# Patient Record
Sex: Male | Born: 1945 | Race: White | Hispanic: No | Marital: Married | Smoking: Former smoker
Health system: Southern US, Community
[De-identification: ages and names within clinical notes are randomized; demographics above are authoritative.]

## PROBLEM LIST (undated history)

## (undated) DIAGNOSIS — E785 Hyperlipidemia, unspecified: Secondary | ICD-10-CM

## (undated) DIAGNOSIS — I10 Essential (primary) hypertension: Secondary | ICD-10-CM

## (undated) HISTORY — DX: Essential (primary) hypertension: I10

## (undated) HISTORY — DX: Hyperlipidemia, unspecified: E78.5

---

## 1997-08-16 HISTORY — PX: THYROIDECTOMY, PARTIAL: SHX18

## 2002-12-06 ENCOUNTER — Ambulatory Visit (HOSPITAL_BASED_OUTPATIENT_CLINIC_OR_DEPARTMENT_OTHER): Admission: RE | Admit: 2002-12-06 | Discharge: 2002-12-06 | Payer: Self-pay | Admitting: Orthopedic Surgery

## 2005-12-09 ENCOUNTER — Ambulatory Visit: Payer: Self-pay | Admitting: Gastroenterology

## 2005-12-27 ENCOUNTER — Ambulatory Visit: Payer: Self-pay | Admitting: Gastroenterology

## 2006-12-16 ENCOUNTER — Encounter: Admission: RE | Admit: 2006-12-16 | Discharge: 2006-12-16 | Payer: Self-pay | Admitting: Geriatric Medicine

## 2006-12-21 ENCOUNTER — Encounter (INDEPENDENT_AMBULATORY_CARE_PROVIDER_SITE_OTHER): Payer: Self-pay | Admitting: Specialist

## 2006-12-21 ENCOUNTER — Other Ambulatory Visit: Admission: RE | Admit: 2006-12-21 | Discharge: 2006-12-21 | Payer: Self-pay | Admitting: Interventional Radiology

## 2006-12-21 ENCOUNTER — Encounter: Admission: RE | Admit: 2006-12-21 | Discharge: 2006-12-21 | Payer: Self-pay | Admitting: Geriatric Medicine

## 2007-03-16 ENCOUNTER — Ambulatory Visit (HOSPITAL_COMMUNITY): Admission: RE | Admit: 2007-03-16 | Discharge: 2007-03-17 | Payer: Self-pay | Admitting: Surgery

## 2007-03-16 ENCOUNTER — Encounter (INDEPENDENT_AMBULATORY_CARE_PROVIDER_SITE_OTHER): Payer: Self-pay | Admitting: Surgery

## 2010-08-18 ENCOUNTER — Encounter
Admission: RE | Admit: 2010-08-18 | Discharge: 2010-08-18 | Payer: Self-pay | Source: Home / Self Care | Attending: Geriatric Medicine | Admitting: Geriatric Medicine

## 2010-12-29 NOTE — Op Note (Signed)
NAME:  Damon Sanders, Damon Sanders             ACCOUNT NO.:  [id number]   MEDICAL RECORD NO.:  [id number]          PATIENT TYPE:  OIB   LOCATION:  1303                         FACILITY:  [facility]   PHYSICIAN:  Velora Heckler, MD      DATE OF BIRTH:  Nov 21, 1945   DATE OF PROCEDURE:  03/16/2007  DATE OF DISCHARGE:                               OPERATIVE REPORT   PREOPERATIVE DIAGNOSIS:  Left thyroid nodule.   POSTOPERATIVE DIAGNOSIS:  Left thyroid nodule.   PROCEDURE:  Left thyroid lobectomy.   SURGEON:  Velora Heckler, M.D., FACS   ASSISTANT:  Clovis Pu. Cornett, M.D., FACS   ANESTHESIA:  General per Dr. Sherrian Divers.   ESTIMATED BLOOD LOSS:  Minimal.   PREPARATION:  Betadine.   COMPLICATIONS:  None.   INDICATIONS:  The patient is a 65 year old white male from [place],  [place].  In April 2008, he was found to have a palpable nodule  on routine physical examination by Dr. Merlene Laughter.  Ultrasound showed  a mildly enlarged thyroid gland.  There was a dominant nodule in the  left inferior pole measuring greater than 2 cm in size.  Cytopathology  on needle biopsy showed mild nuclear enlargement and nuclear clearing.  The patient now comes to surgery for resection for definitive diagnosis.   BODY OF REPORT:  The procedure is done in OR 11 at [facility].  The patient is brought to the operating room and placed in the supine  position on the operating room table.  Following the administration of  general anesthesia, the patient is prepped and draped in the usual  strict aseptic fashion.  After ascertaining that an adequate level of  anesthesia had been obtained, a Kocher incision was made a #15 blade.  Dissection was carried through subcutaneous tissues and platysma.  Hemostasis was obtained with electrocautery.  Skin flaps were elevated  cephalad and caudad from the thyroid notch to the sternal notch.  A  Mahorner self-retaining retractor was placed for exposure.  The  strap  muscles were incised in the midline.   Dissection was initially begun on the left side.  The strap muscles were  reflected laterally exposing the left thyroid lobe.  The superior pole  was dissected out.  The superior pole vessels were divided between  medium Liga clips with the harmonic scalpel.  Inferior venous  tributaries were divided between medium Liga clips with the harmonic  scalpel.  The gland is rolled anteriorly.  Parathyroid tissue was  identified and preserved.  Branches of the inferior thyroid artery are  divided between small hemoclips with the harmonic scalpel.  The  recurrent nerve was identified and preserved.  The gland is rolled  further anteriorly and the ligament of Allyson Sabal is transected with  electrocautery.  The isthmus is mobilized across the midline.  A small  pyramidal lobe was included with the isthmus.  The isthmus is transected  to the right of midline with the harmonic scalpel.  The specimen is  submitted to pathology labeled as left thyroid lobe.  Good hemostasis  was noted in the left  thyroid bed.  Surgicel was placed in the operative  field.   The right thyroid lobe was then exposed by reflecting the strap muscles  laterally.  There is a subcentimeter nodule just at the margin of the  anterior right lobe.  This is excised with the harmonic scalpel and  submitted separately labeled as right isthmus nodule.  Good hemostasis  was noted.  The strap muscles were reapproximated in the midline with  interrupted 3-0 Vicryl sutures.  The platysma was closed with  interrupted 3-0 Vicryl sutures.  The skin was closed with a running 4-0  Monocryl subcuticular suture.  The wound was washed and dried.  Benzoin  and Steri-Strips were applied.  Sterile dressings were applied.  The  patient is awakened from anesthesia and brought to the recovery room in  stable condition.  The patient tolerated the procedure well.      Velora Heckler, MD  Electronically  Signed     TMG/MEDQ  D:  03/16/2007  T:  03/16/2007  Job:  [id number]   cc:   Hal T. Stoneking, M.D.  Fax: [phone]

## 2011-01-01 NOTE — Op Note (Signed)
   NAME:  Damon Sanders, Damon Sanders                       ACCOUNT NO.:  [id number]   MEDICAL RECORD NO.:  [id number]                   PATIENT TYPE:  AMB   LOCATION:  DSC                                  FACILITY:  [facility]   PHYSICIAN:  Loreta Ave, M.D.              DATE OF BIRTH:  12-02-1945   DATE OF PROCEDURE:  12/06/2002  DATE OF DISCHARGE:                                 OPERATIVE REPORT   PREOPERATIVE DIAGNOSES:  Symptomatic mucinous cyst ganglion, dorsal aspect  of proximal interphalangeal joint, right index finger.   POSTOPERATIVE DIAGNOSES:  Symptomatic mucinous cyst ganglion, dorsal aspect  of proximal interphalangeal joint, right index finger.   OPERATION PERFORMED:  Excision of mucinous cyst ganglion dorsal aspect of  distal interphalangeal joint, right index finger with joint exploration.   SURGEON:  Loreta Ave, M.D.   ASSISTANT:  Arlys John D. Petrarca, P.A.-C.   ANESTHESIA:  IV region.   SPECIMENS:  None.   CULTURES:  None.   COMPLICATIONS:  None.   DRESSING:  Soft compressive.   DESCRIPTION OF PROCEDURE:  The patient was brought to the operating room and  after adequate anesthesia had been obtained, the right arm was prepped and  draped in the usual sterile fashion.  The cyst was in typical location on  the dorsal aspect of the PIP joint, right index finger, ulnar aspect in the  extensor tendon.  Transverse exposure.  Cyst excised in its entirety,  carrying this all the way down to the DIP joint where the cyst was emanating  adjacent to the extensor tendon.  Excised in its entirety.  The window into  the joint opened and the joint explored with minimal degenerative changes.  No significant periarticular spurring.  Longitudinal continuity of the  extensor tendon and collateral ligament maintained.  Wound irrigated and  then closed with nylon.  Sterile compressive dressing applied.  Anesthesia  reversed.  Brought to recovery room.  Tolerated surgery well  without  complication.                                                Loreta Ave, M.D.    DFM/MEDQ  D:  12/06/2002  T:  12/07/2002  Job:  [id number]

## 2011-01-19 ENCOUNTER — Encounter: Payer: Self-pay | Admitting: Gastroenterology

## 2011-05-31 LAB — BASIC METABOLIC PANEL
BUN: 8
Calcium: 9.7
GFR calc non Af Amer: >60
Glucose, Bld: 109 — ABNORMAL HIGH
Potassium: 4
Sodium: 142

## 2011-05-31 LAB — DIFFERENTIAL
Basophils Absolute: 0
Basophils Relative: 0
Lymphocytes Relative: 25
Neutro Abs: 4.5
Neutrophils Relative %: 67

## 2011-05-31 LAB — PROTIME-INR: INR: 1

## 2011-05-31 LAB — URINALYSIS, ROUTINE W REFLEX MICROSCOPIC
Hgb urine dipstick: NEGATIVE
Nitrite: NEGATIVE
Protein, ur: NEGATIVE
Specific Gravity, Urine: 1.009
Urobilinogen, UA: 0.2

## 2011-05-31 LAB — CBC
MCHC: 34.5
RBC: 4.46
RDW: 13

## 2011-09-28 ENCOUNTER — Other Ambulatory Visit: Payer: Self-pay | Admitting: Geriatric Medicine

## 2011-09-28 DIAGNOSIS — E049 Nontoxic goiter, unspecified: Secondary | ICD-10-CM

## 2011-09-29 ENCOUNTER — Ambulatory Visit
Admission: RE | Admit: 2011-09-29 | Discharge: 2011-09-29 | Disposition: A | Discharge status: Home / Self Care | Payer: BC Managed Care – PPO | Source: Ambulatory Visit | Attending: Geriatric Medicine | Admitting: Geriatric Medicine

## 2011-09-29 DIAGNOSIS — E049 Nontoxic goiter, unspecified: Secondary | ICD-10-CM

## 2012-05-05 ENCOUNTER — Encounter: Payer: Self-pay | Admitting: Gastroenterology

## 2012-05-10 ENCOUNTER — Encounter: Payer: Self-pay | Admitting: Gastroenterology

## 2012-06-12 ENCOUNTER — Encounter: Payer: Self-pay | Admitting: Gastroenterology

## 2012-06-12 ENCOUNTER — Ambulatory Visit (AMBULATORY_SURGERY_CENTER): Payer: BC Managed Care – PPO | Admitting: *Deleted

## 2012-06-12 VITALS — Ht 67.0 in | Wt 174.0 lb

## 2012-06-12 DIAGNOSIS — Z1211 Encounter for screening for malignant neoplasm of colon: Secondary | ICD-10-CM

## 2012-06-12 MED ORDER — MOVIPREP 100 G PO SOLR: ORAL | Status: DC

## 2012-06-26 ENCOUNTER — Other Ambulatory Visit: Payer: Self-pay | Admitting: Gastroenterology

## 2012-06-26 ENCOUNTER — Ambulatory Visit (AMBULATORY_SURGERY_CENTER): Payer: BC Managed Care – PPO | Admitting: Gastroenterology

## 2012-06-26 ENCOUNTER — Encounter: Payer: Self-pay | Admitting: Gastroenterology

## 2012-06-26 VITALS — BP 142/78 | HR 69 | Temp 98.1°F | Resp 12 | Ht 67.0 in | Wt 174.0 lb

## 2012-06-26 DIAGNOSIS — Z1211 Encounter for screening for malignant neoplasm of colon: Secondary | ICD-10-CM

## 2012-06-26 MED ORDER — SODIUM CHLORIDE 0.9 % IV SOLN: 500.0000 mL | INTRAVENOUS | Status: DC

## 2012-06-26 MED ORDER — HYDROCORTISONE ACETATE 1 % EX CREA: 1.0000 "application " | TOPICAL_CREAM | Freq: Every evening | CUTANEOUS | Status: DC | PRN

## 2012-06-26 NOTE — Patient Instructions (Addendum)
YOU HAD AN ENDOSCOPIC PROCEDURE TODAY AT THE [facility]: Refer to the procedure report that was given to you for any specific questions about what was found during the examination.  If the procedure report does not answer your questions, please call your gastroenterologist to clarify.  If you requested that your care partner not be given the details of your procedure findings, then the procedure report has been included in a sealed envelope for you to review at your convenience later.  YOU SHOULD EXPECT: Some feelings of bloating in the abdomen. Passage of more gas than usual.  Walking can help get rid of the air that was put into your GI tract during the procedure and reduce the bloating. If you had a lower endoscopy (such as a colonoscopy or flexible sigmoidoscopy) you may notice spotting of blood in your stool or on the toilet paper. If you underwent a bowel prep for your procedure, then you may not have a normal bowel movement for a few days.  DIET: Your first meal following the procedure should be a light meal and then it is ok to progress to your normal diet.  A half-sandwich or bowl of soup is an example of a good first meal.  Heavy or fried foods are harder to digest and may make you feel nauseous or bloated.  Likewise meals heavy in dairy and vegetables can cause extra gas to form and this can also increase the bloating.  Drink plenty of fluids but you should avoid alcoholic beverages for 24 hours.  ACTIVITY: Your care partner should take you home directly after the procedure.  You should plan to take it easy, moving slowly for the rest of the day.  You can resume normal activity the day after the procedure however you should NOT DRIVE or use heavy machinery for 24 hours (because of the sedation medicines used during the test).    SYMPTOMS TO REPORT IMMEDIATELY: A gastroenterologist can be reached at any hour.  During normal business hours, 8:30 AM to 5:00 PM Monday through Friday,  call [phone].  After hours and on weekends, please call the GI answering service at [phone] who will take a message and have the physician on call contact you.   Following lower endoscopy (colonoscopy or flexible sigmoidoscopy):  Excessive amounts of blood in the stool  Significant tenderness or worsening of abdominal pains  Swelling of the abdomen that is new, acute  Fever of 100F or higher     FOLLOW UP: If any biopsies were taken you will be contacted by phone or by letter within the next 1-3 weeks.  Call your gastroenterologist if you have not heard about the biopsies in 3 weeks.  Our staff will call the home number listed on your records the next business day following your procedure to check on you and address any questions or concerns that you may have at that time regarding the information given to you following your procedure. This is a courtesy call and so if there is no answer at the home number and we have not heard from you through the emergency physician on call, we will assume that you have returned to your regular daily activities without incident.  SIGNATURES/CONFIDENTIALITY: You and/or your care partner have signed paperwork which will be entered into your electronic medical record.  These signatures attest to the fact that that the information above on your After Visit Summary has been reviewed and is understood.  Full responsibility of the  confidentiality of this discharge information lies with you and/or your care-partner.   Hemorrhoid information given.  Repeat colonoscopy in 5 years due to family history of colon cancer.

## 2012-06-26 NOTE — Progress Notes (Signed)
Patient did not experience any of the following events: a burn prior to discharge; a fall within the facility; wrong site/side/patient/procedure/implant event; or a hospital transfer or hospital admission upon discharge from the facility. (G8907) Patient did not have preoperative order for IV antibiotic SSI prophylaxis. (G8918)  

## 2012-06-26 NOTE — Op Note (Signed)
[facility] [address] [place], [address]   COLONOSCOPY PROCEDURE REPORT  PATIENT: Damon Sanders, Damon Sanders  MR#: [id number] BIRTHDATE: 09/18/1945 , 66  yrs. old GENDER: Male ENDOSCOPIST: Mardella Layman, MD, [facility] REFERRED BY: PROCEDURE DATE:  06/26/2012 PROCEDURE:   Colonoscopy, screening ASA CLASS:   Class II INDICATIONS:patient's immediate family history of colon cancer. MEDICATIONS: propofol (Diprivan) 200mg  IV  DESCRIPTION OF PROCEDURE:   After the risks and benefits and of the procedure were explained, informed consent was obtained.  A digital rectal exam revealed external hemorrhoids.    The LB CF-H180AL [id number]  endoscope was introduced through the anus and advanced to the cecum, which was identified by both the appendix and ileocecal valve .  The quality of the prep was adequate, using MoviPrep . The instrument was then slowly withdrawn as the colon was fully examined.     COLON FINDINGS: A normal appearing cecum, ileocecal valve, and appendiceal orifice were identified.  The ascending, hepatic flexure, transverse, splenic flexure, descending, sigmoid colon and rectum appeared unremarkable.  No polyps or cancers were seen. External hemorrhoids.  Retroflexed views revealed external hemorrhoids.     The scope was then withdrawn from the patient and the procedure completed.  COMPLICATIONS: There were no complications. ENDOSCOPIC IMPRESSION: 1.   Normal colon 2.   External hemorrhoids RECOMMENDATIONS: Given your significant family history of colon cancer, you should have a repeat colonoscopy in 5 years   REPEAT EXAM:  cc:Dr.Stoneking  _______________________________ eSignedMardella Layman, MD, [facility] 06/26/2012 9:30 AM

## 2012-06-27 ENCOUNTER — Telehealth: Payer: Self-pay | Admitting: *Deleted

## 2012-06-27 NOTE — Telephone Encounter (Signed)
  Follow up Call-  Call back number 06/26/2012  Post procedure Call Back phone  # [phone] hm speak with Darleen  Permission to leave phone message Yes     Patient questions:  Do you have a fever, pain , or abdominal swelling? no Pain Score  0 *  Have you tolerated food without any problems? yes  Have you been able to return to your normal activities? yes  Do you have any questions about your discharge instructions: Diet   no Medications  no Follow up visit  no  Do you have questions or concerns about your Care? no  Actions: * If pain score is 4 or above: No action needed, pain <4.  PT. Gone to work information provided via spouse.

## 2012-08-22 ENCOUNTER — Other Ambulatory Visit: Payer: Self-pay | Admitting: Geriatric Medicine

## 2012-08-22 DIAGNOSIS — E049 Nontoxic goiter, unspecified: Secondary | ICD-10-CM

## 2012-08-23 ENCOUNTER — Ambulatory Visit
Admission: RE | Admit: 2012-08-23 | Discharge: 2012-08-23 | Disposition: A | Discharge status: Home / Self Care | Payer: BC Managed Care – PPO | Source: Ambulatory Visit | Attending: Geriatric Medicine | Admitting: Geriatric Medicine

## 2012-08-23 DIAGNOSIS — E049 Nontoxic goiter, unspecified: Secondary | ICD-10-CM

## 2012-12-12 ENCOUNTER — Other Ambulatory Visit: Payer: Self-pay | Admitting: Oral Surgery

## 2013-08-29 ENCOUNTER — Other Ambulatory Visit: Payer: Self-pay | Admitting: Geriatric Medicine

## 2013-08-29 DIAGNOSIS — E049 Nontoxic goiter, unspecified: Secondary | ICD-10-CM

## 2013-09-03 ENCOUNTER — Ambulatory Visit
Admission: RE | Admit: 2013-09-03 | Discharge: 2013-09-03 | Disposition: A | Discharge status: Home / Self Care | Payer: BC Managed Care – PPO | Source: Ambulatory Visit | Attending: Geriatric Medicine | Admitting: Geriatric Medicine

## 2013-09-03 DIAGNOSIS — E049 Nontoxic goiter, unspecified: Secondary | ICD-10-CM

## 2013-09-04 ENCOUNTER — Other Ambulatory Visit: Payer: Self-pay | Admitting: Geriatric Medicine

## 2013-09-04 DIAGNOSIS — E041 Nontoxic single thyroid nodule: Secondary | ICD-10-CM

## 2013-09-05 ENCOUNTER — Ambulatory Visit
Admission: RE | Admit: 2013-09-05 | Discharge: 2013-09-05 | Disposition: A | Discharge status: Home / Self Care | Payer: BC Managed Care – PPO | Source: Ambulatory Visit | Attending: Geriatric Medicine | Admitting: Geriatric Medicine

## 2013-09-05 ENCOUNTER — Other Ambulatory Visit (HOSPITAL_COMMUNITY)
Admission: RE | Admit: 2013-09-05 | Discharge: 2013-09-05 | Disposition: A | Discharge status: Home / Self Care | Payer: BC Managed Care – PPO | Source: Ambulatory Visit | Attending: Interventional Radiology | Admitting: Interventional Radiology

## 2013-09-05 DIAGNOSIS — E041 Nontoxic single thyroid nodule: Secondary | ICD-10-CM

## 2014-03-11 ENCOUNTER — Other Ambulatory Visit: Payer: Self-pay | Admitting: Geriatric Medicine

## 2014-03-11 DIAGNOSIS — Z87891 Personal history of nicotine dependence: Secondary | ICD-10-CM

## 2014-03-11 DIAGNOSIS — Z136 Encounter for screening for cardiovascular disorders: Secondary | ICD-10-CM

## 2014-03-11 DIAGNOSIS — Z0000 Encounter for general adult medical examination without abnormal findings: Secondary | ICD-10-CM

## 2014-03-25 ENCOUNTER — Ambulatory Visit
Admission: RE | Admit: 2014-03-25 | Discharge: 2014-03-25 | Disposition: A | Discharge status: Home / Self Care | Payer: Commercial Managed Care - HMO | Source: Ambulatory Visit | Attending: Geriatric Medicine | Admitting: Geriatric Medicine

## 2014-03-25 DIAGNOSIS — Z136 Encounter for screening for cardiovascular disorders: Secondary | ICD-10-CM

## 2014-03-25 DIAGNOSIS — Z0000 Encounter for general adult medical examination without abnormal findings: Secondary | ICD-10-CM

## 2014-03-25 DIAGNOSIS — Z87891 Personal history of nicotine dependence: Secondary | ICD-10-CM

## 2014-08-20 DIAGNOSIS — H4011X2 Primary open-angle glaucoma, moderate stage: Secondary | ICD-10-CM | POA: Diagnosis not present

## 2014-08-20 DIAGNOSIS — H2513 Age-related nuclear cataract, bilateral: Secondary | ICD-10-CM | POA: Diagnosis not present

## 2014-08-20 DIAGNOSIS — H4011X1 Primary open-angle glaucoma, mild stage: Secondary | ICD-10-CM | POA: Diagnosis not present

## 2014-10-10 DIAGNOSIS — E780 Pure hypercholesterolemia: Secondary | ICD-10-CM | POA: Diagnosis not present

## 2014-10-10 DIAGNOSIS — Z0000 Encounter for general adult medical examination without abnormal findings: Secondary | ICD-10-CM | POA: Diagnosis not present

## 2014-10-10 DIAGNOSIS — I10 Essential (primary) hypertension: Secondary | ICD-10-CM | POA: Diagnosis not present

## 2014-10-10 DIAGNOSIS — I4510 Unspecified right bundle-branch block: Secondary | ICD-10-CM | POA: Diagnosis not present

## 2014-10-10 DIAGNOSIS — Z1389 Encounter for screening for other disorder: Secondary | ICD-10-CM | POA: Diagnosis not present

## 2014-10-10 DIAGNOSIS — Z23 Encounter for immunization: Secondary | ICD-10-CM | POA: Diagnosis not present

## 2014-10-10 DIAGNOSIS — Z79899 Other long term (current) drug therapy: Secondary | ICD-10-CM | POA: Diagnosis not present

## 2014-10-10 DIAGNOSIS — E042 Nontoxic multinodular goiter: Secondary | ICD-10-CM | POA: Diagnosis not present

## 2014-10-11 ENCOUNTER — Other Ambulatory Visit: Payer: Self-pay | Admitting: Geriatric Medicine

## 2014-10-11 DIAGNOSIS — E049 Nontoxic goiter, unspecified: Secondary | ICD-10-CM

## 2014-10-15 ENCOUNTER — Ambulatory Visit (HOSPITAL_COMMUNITY): Payer: Commercial Managed Care - HMO | Attending: Geriatric Medicine | Admitting: Cardiology

## 2014-10-15 ENCOUNTER — Other Ambulatory Visit (HOSPITAL_COMMUNITY): Payer: Self-pay | Admitting: Geriatric Medicine

## 2014-10-15 ENCOUNTER — Ambulatory Visit
Admission: RE | Admit: 2014-10-15 | Discharge: 2014-10-15 | Disposition: A | Discharge status: Home / Self Care | Payer: Commercial Managed Care - HMO | Source: Ambulatory Visit | Attending: Geriatric Medicine | Admitting: Geriatric Medicine

## 2014-10-15 DIAGNOSIS — E049 Nontoxic goiter, unspecified: Secondary | ICD-10-CM

## 2014-10-15 DIAGNOSIS — I4510 Unspecified right bundle-branch block: Secondary | ICD-10-CM

## 2014-10-15 DIAGNOSIS — E041 Nontoxic single thyroid nodule: Secondary | ICD-10-CM | POA: Diagnosis not present

## 2014-10-15 NOTE — Progress Notes (Signed)
Echo performed. 

## 2014-10-29 DIAGNOSIS — H2513 Age-related nuclear cataract, bilateral: Secondary | ICD-10-CM | POA: Diagnosis not present

## 2014-10-29 DIAGNOSIS — H4011X1 Primary open-angle glaucoma, mild stage: Secondary | ICD-10-CM | POA: Diagnosis not present

## 2014-10-29 DIAGNOSIS — H4011X2 Primary open-angle glaucoma, moderate stage: Secondary | ICD-10-CM | POA: Diagnosis not present

## 2015-04-15 DIAGNOSIS — I10 Essential (primary) hypertension: Secondary | ICD-10-CM | POA: Diagnosis not present

## 2015-04-15 DIAGNOSIS — L729 Follicular cyst of the skin and subcutaneous tissue, unspecified: Secondary | ICD-10-CM | POA: Diagnosis not present

## 2015-04-15 DIAGNOSIS — E780 Pure hypercholesterolemia: Secondary | ICD-10-CM | POA: Diagnosis not present

## 2015-04-18 DIAGNOSIS — H2513 Age-related nuclear cataract, bilateral: Secondary | ICD-10-CM | POA: Diagnosis not present

## 2015-04-18 DIAGNOSIS — H4011X1 Primary open-angle glaucoma, mild stage: Secondary | ICD-10-CM | POA: Diagnosis not present

## 2015-04-18 DIAGNOSIS — D2311 Other benign neoplasm of skin of right eyelid, including canthus: Secondary | ICD-10-CM | POA: Diagnosis not present

## 2015-05-29 ENCOUNTER — Other Ambulatory Visit: Payer: Self-pay | Admitting: Ophthalmology

## 2015-05-29 DIAGNOSIS — D2311 Other benign neoplasm of skin of right eyelid, including canthus: Secondary | ICD-10-CM | POA: Diagnosis not present

## 2015-05-29 DIAGNOSIS — L820 Inflamed seborrheic keratosis: Secondary | ICD-10-CM | POA: Diagnosis not present

## 2015-08-28 DIAGNOSIS — H903 Sensorineural hearing loss, bilateral: Secondary | ICD-10-CM | POA: Diagnosis not present

## 2015-10-16 DIAGNOSIS — H2513 Age-related nuclear cataract, bilateral: Secondary | ICD-10-CM | POA: Diagnosis not present

## 2015-10-16 DIAGNOSIS — H401122 Primary open-angle glaucoma, left eye, moderate stage: Secondary | ICD-10-CM | POA: Diagnosis not present

## 2015-10-16 DIAGNOSIS — H401111 Primary open-angle glaucoma, right eye, mild stage: Secondary | ICD-10-CM | POA: Diagnosis not present

## 2015-10-17 ENCOUNTER — Other Ambulatory Visit: Payer: Self-pay | Admitting: Geriatric Medicine

## 2015-10-17 DIAGNOSIS — Z0000 Encounter for general adult medical examination without abnormal findings: Secondary | ICD-10-CM | POA: Diagnosis not present

## 2015-10-17 DIAGNOSIS — E049 Nontoxic goiter, unspecified: Secondary | ICD-10-CM

## 2015-10-17 DIAGNOSIS — I10 Essential (primary) hypertension: Secondary | ICD-10-CM | POA: Diagnosis not present

## 2015-10-17 DIAGNOSIS — Z79899 Other long term (current) drug therapy: Secondary | ICD-10-CM | POA: Diagnosis not present

## 2015-10-17 DIAGNOSIS — Z1389 Encounter for screening for other disorder: Secondary | ICD-10-CM | POA: Diagnosis not present

## 2015-10-17 DIAGNOSIS — E7800 Pure hypercholesterolemia, unspecified: Secondary | ICD-10-CM | POA: Diagnosis not present

## 2015-10-17 DIAGNOSIS — E042 Nontoxic multinodular goiter: Secondary | ICD-10-CM | POA: Diagnosis not present

## 2015-10-21 ENCOUNTER — Ambulatory Visit
Admission: RE | Admit: 2015-10-21 | Discharge: 2015-10-21 | Disposition: A | Discharge status: Home / Self Care | Payer: Commercial Managed Care - HMO | Source: Ambulatory Visit | Attending: Geriatric Medicine | Admitting: Geriatric Medicine

## 2015-10-21 DIAGNOSIS — E042 Nontoxic multinodular goiter: Secondary | ICD-10-CM | POA: Diagnosis not present

## 2015-10-21 DIAGNOSIS — E049 Nontoxic goiter, unspecified: Secondary | ICD-10-CM

## 2015-10-31 DIAGNOSIS — K529 Noninfective gastroenteritis and colitis, unspecified: Secondary | ICD-10-CM | POA: Diagnosis not present

## 2015-10-31 DIAGNOSIS — R12 Heartburn: Secondary | ICD-10-CM | POA: Diagnosis not present

## 2016-04-20 DIAGNOSIS — Z23 Encounter for immunization: Secondary | ICD-10-CM | POA: Diagnosis not present

## 2016-04-20 DIAGNOSIS — I10 Essential (primary) hypertension: Secondary | ICD-10-CM | POA: Diagnosis not present

## 2016-04-20 DIAGNOSIS — R300 Dysuria: Secondary | ICD-10-CM | POA: Diagnosis not present

## 2016-04-22 DIAGNOSIS — H401122 Primary open-angle glaucoma, left eye, moderate stage: Secondary | ICD-10-CM | POA: Diagnosis not present

## 2016-04-22 DIAGNOSIS — H401111 Primary open-angle glaucoma, right eye, mild stage: Secondary | ICD-10-CM | POA: Diagnosis not present

## 2016-05-06 DIAGNOSIS — R739 Hyperglycemia, unspecified: Secondary | ICD-10-CM | POA: Diagnosis not present

## 2016-05-06 DIAGNOSIS — I10 Essential (primary) hypertension: Secondary | ICD-10-CM | POA: Diagnosis not present

## 2016-05-06 DIAGNOSIS — R202 Paresthesia of skin: Secondary | ICD-10-CM | POA: Diagnosis not present

## 2016-05-10 DIAGNOSIS — G5600 Carpal tunnel syndrome, unspecified upper limb: Secondary | ICD-10-CM | POA: Diagnosis not present

## 2016-05-10 DIAGNOSIS — G629 Polyneuropathy, unspecified: Secondary | ICD-10-CM | POA: Diagnosis not present

## 2016-05-10 DIAGNOSIS — G5620 Lesion of ulnar nerve, unspecified upper limb: Secondary | ICD-10-CM | POA: Diagnosis not present

## 2016-05-19 DIAGNOSIS — G629 Polyneuropathy, unspecified: Secondary | ICD-10-CM | POA: Diagnosis not present

## 2016-10-27 ENCOUNTER — Other Ambulatory Visit: Payer: Self-pay | Admitting: Geriatric Medicine

## 2016-10-27 DIAGNOSIS — Z0000 Encounter for general adult medical examination without abnormal findings: Secondary | ICD-10-CM | POA: Diagnosis not present

## 2016-10-27 DIAGNOSIS — Z79899 Other long term (current) drug therapy: Secondary | ICD-10-CM | POA: Diagnosis not present

## 2016-10-27 DIAGNOSIS — H401122 Primary open-angle glaucoma, left eye, moderate stage: Secondary | ICD-10-CM | POA: Diagnosis not present

## 2016-10-27 DIAGNOSIS — H2513 Age-related nuclear cataract, bilateral: Secondary | ICD-10-CM | POA: Diagnosis not present

## 2016-10-27 DIAGNOSIS — E042 Nontoxic multinodular goiter: Secondary | ICD-10-CM | POA: Diagnosis not present

## 2016-10-27 DIAGNOSIS — R05 Cough: Secondary | ICD-10-CM | POA: Diagnosis not present

## 2016-10-27 DIAGNOSIS — Z1389 Encounter for screening for other disorder: Secondary | ICD-10-CM | POA: Diagnosis not present

## 2016-10-27 DIAGNOSIS — E7800 Pure hypercholesterolemia, unspecified: Secondary | ICD-10-CM | POA: Diagnosis not present

## 2016-10-27 DIAGNOSIS — H401111 Primary open-angle glaucoma, right eye, mild stage: Secondary | ICD-10-CM | POA: Diagnosis not present

## 2016-10-27 DIAGNOSIS — I10 Essential (primary) hypertension: Secondary | ICD-10-CM | POA: Diagnosis not present

## 2016-10-27 DIAGNOSIS — Z125 Encounter for screening for malignant neoplasm of prostate: Secondary | ICD-10-CM | POA: Diagnosis not present

## 2016-10-27 DIAGNOSIS — H02834 Dermatochalasis of left upper eyelid: Secondary | ICD-10-CM | POA: Diagnosis not present

## 2016-10-27 DIAGNOSIS — H02831 Dermatochalasis of right upper eyelid: Secondary | ICD-10-CM | POA: Diagnosis not present

## 2016-11-01 ENCOUNTER — Ambulatory Visit
Admission: RE | Admit: 2016-11-01 | Discharge: 2016-11-01 | Disposition: A | Discharge status: Home / Self Care | Payer: Medicare HMO | Source: Ambulatory Visit | Attending: Geriatric Medicine | Admitting: Geriatric Medicine

## 2016-11-01 DIAGNOSIS — E042 Nontoxic multinodular goiter: Secondary | ICD-10-CM | POA: Diagnosis not present

## 2016-11-02 ENCOUNTER — Other Ambulatory Visit: Payer: Self-pay | Admitting: Geriatric Medicine

## 2016-11-02 DIAGNOSIS — E042 Nontoxic multinodular goiter: Secondary | ICD-10-CM

## 2017-04-05 ENCOUNTER — Encounter: Payer: Self-pay | Admitting: *Deleted

## 2017-05-03 DIAGNOSIS — Z23 Encounter for immunization: Secondary | ICD-10-CM | POA: Diagnosis not present

## 2017-05-03 DIAGNOSIS — I10 Essential (primary) hypertension: Secondary | ICD-10-CM | POA: Diagnosis not present

## 2017-05-03 DIAGNOSIS — Z79899 Other long term (current) drug therapy: Secondary | ICD-10-CM | POA: Diagnosis not present

## 2017-05-05 ENCOUNTER — Ambulatory Visit
Admission: RE | Admit: 2017-05-05 | Discharge: 2017-05-05 | Disposition: A | Discharge status: Home / Self Care | Payer: Medicare HMO | Source: Ambulatory Visit | Attending: Geriatric Medicine | Admitting: Geriatric Medicine

## 2017-05-05 DIAGNOSIS — E042 Nontoxic multinodular goiter: Secondary | ICD-10-CM | POA: Diagnosis not present

## 2017-05-06 DIAGNOSIS — H2513 Age-related nuclear cataract, bilateral: Secondary | ICD-10-CM | POA: Diagnosis not present

## 2017-05-06 DIAGNOSIS — H401122 Primary open-angle glaucoma, left eye, moderate stage: Secondary | ICD-10-CM | POA: Diagnosis not present

## 2017-05-06 DIAGNOSIS — H401111 Primary open-angle glaucoma, right eye, mild stage: Secondary | ICD-10-CM | POA: Diagnosis not present

## 2017-07-18 ENCOUNTER — Encounter: Payer: Self-pay | Admitting: Internal Medicine

## 2017-09-02 DIAGNOSIS — H2513 Age-related nuclear cataract, bilateral: Secondary | ICD-10-CM | POA: Diagnosis not present

## 2017-09-02 DIAGNOSIS — H401111 Primary open-angle glaucoma, right eye, mild stage: Secondary | ICD-10-CM | POA: Diagnosis not present

## 2017-09-02 DIAGNOSIS — H401122 Primary open-angle glaucoma, left eye, moderate stage: Secondary | ICD-10-CM | POA: Diagnosis not present

## 2017-11-01 ENCOUNTER — Other Ambulatory Visit: Payer: Self-pay | Admitting: Geriatric Medicine

## 2017-11-01 DIAGNOSIS — I10 Essential (primary) hypertension: Secondary | ICD-10-CM | POA: Diagnosis not present

## 2017-11-01 DIAGNOSIS — Z1211 Encounter for screening for malignant neoplasm of colon: Secondary | ICD-10-CM | POA: Diagnosis not present

## 2017-11-01 DIAGNOSIS — Z79899 Other long term (current) drug therapy: Secondary | ICD-10-CM | POA: Diagnosis not present

## 2017-11-01 DIAGNOSIS — E7800 Pure hypercholesterolemia, unspecified: Secondary | ICD-10-CM | POA: Diagnosis not present

## 2017-11-01 DIAGNOSIS — E042 Nontoxic multinodular goiter: Secondary | ICD-10-CM | POA: Diagnosis not present

## 2017-11-01 DIAGNOSIS — Z0000 Encounter for general adult medical examination without abnormal findings: Secondary | ICD-10-CM | POA: Diagnosis not present

## 2017-11-01 DIAGNOSIS — Z1389 Encounter for screening for other disorder: Secondary | ICD-10-CM | POA: Diagnosis not present

## 2017-11-07 ENCOUNTER — Ambulatory Visit
Admission: RE | Admit: 2017-11-07 | Discharge: 2017-11-07 | Disposition: A | Discharge status: Home / Self Care | Payer: Medicare HMO | Source: Ambulatory Visit | Attending: Geriatric Medicine | Admitting: Geriatric Medicine

## 2017-11-07 DIAGNOSIS — E042 Nontoxic multinodular goiter: Secondary | ICD-10-CM | POA: Diagnosis not present

## 2017-12-20 DIAGNOSIS — Z800 Family history of malignant neoplasm of digestive organs: Secondary | ICD-10-CM | POA: Diagnosis not present

## 2017-12-20 DIAGNOSIS — K648 Other hemorrhoids: Secondary | ICD-10-CM | POA: Diagnosis not present

## 2017-12-20 DIAGNOSIS — K635 Polyp of colon: Secondary | ICD-10-CM | POA: Diagnosis not present

## 2017-12-23 DIAGNOSIS — K635 Polyp of colon: Secondary | ICD-10-CM | POA: Diagnosis not present

## 2018-03-02 DIAGNOSIS — R05 Cough: Secondary | ICD-10-CM | POA: Diagnosis not present

## 2018-03-02 DIAGNOSIS — H2513 Age-related nuclear cataract, bilateral: Secondary | ICD-10-CM | POA: Diagnosis not present

## 2018-03-02 DIAGNOSIS — H401122 Primary open-angle glaucoma, left eye, moderate stage: Secondary | ICD-10-CM | POA: Diagnosis not present

## 2018-05-04 DIAGNOSIS — I10 Essential (primary) hypertension: Secondary | ICD-10-CM | POA: Diagnosis not present

## 2018-05-04 DIAGNOSIS — J309 Allergic rhinitis, unspecified: Secondary | ICD-10-CM | POA: Diagnosis not present

## 2018-05-04 DIAGNOSIS — Z79899 Other long term (current) drug therapy: Secondary | ICD-10-CM | POA: Diagnosis not present

## 2018-05-04 DIAGNOSIS — Z23 Encounter for immunization: Secondary | ICD-10-CM | POA: Diagnosis not present

## 2018-06-07 DIAGNOSIS — H401111 Primary open-angle glaucoma, right eye, mild stage: Secondary | ICD-10-CM | POA: Diagnosis not present

## 2018-06-07 DIAGNOSIS — H401122 Primary open-angle glaucoma, left eye, moderate stage: Secondary | ICD-10-CM | POA: Diagnosis not present

## 2018-06-07 DIAGNOSIS — H2513 Age-related nuclear cataract, bilateral: Secondary | ICD-10-CM | POA: Diagnosis not present

## 2018-08-28 DIAGNOSIS — I10 Essential (primary) hypertension: Secondary | ICD-10-CM | POA: Diagnosis not present

## 2018-08-28 DIAGNOSIS — M545 Low back pain: Secondary | ICD-10-CM | POA: Diagnosis not present

## 2018-11-30 DIAGNOSIS — M25512 Pain in left shoulder: Secondary | ICD-10-CM | POA: Diagnosis not present

## 2019-01-11 DIAGNOSIS — I509 Heart failure, unspecified: Secondary | ICD-10-CM | POA: Diagnosis not present

## 2019-01-11 DIAGNOSIS — F430 Acute stress reaction: Secondary | ICD-10-CM | POA: Diagnosis not present

## 2019-01-11 DIAGNOSIS — R0602 Shortness of breath: Secondary | ICD-10-CM | POA: Diagnosis not present

## 2019-01-11 DIAGNOSIS — Z955 Presence of coronary angioplasty implant and graft: Secondary | ICD-10-CM | POA: Diagnosis not present

## 2019-01-11 DIAGNOSIS — I4510 Unspecified right bundle-branch block: Secondary | ICD-10-CM | POA: Diagnosis not present

## 2019-01-11 DIAGNOSIS — I110 Hypertensive heart disease with heart failure: Secondary | ICD-10-CM | POA: Diagnosis not present

## 2019-01-11 DIAGNOSIS — I2510 Atherosclerotic heart disease of native coronary artery without angina pectoris: Secondary | ICD-10-CM | POA: Diagnosis not present

## 2019-01-11 DIAGNOSIS — I10 Essential (primary) hypertension: Secondary | ICD-10-CM | POA: Diagnosis not present

## 2019-01-11 DIAGNOSIS — Z79899 Other long term (current) drug therapy: Secondary | ICD-10-CM | POA: Diagnosis not present

## 2019-01-11 DIAGNOSIS — R110 Nausea: Secondary | ICD-10-CM | POA: Diagnosis not present

## 2019-01-11 DIAGNOSIS — M79602 Pain in left arm: Secondary | ICD-10-CM | POA: Diagnosis not present

## 2019-01-11 DIAGNOSIS — E785 Hyperlipidemia, unspecified: Secondary | ICD-10-CM | POA: Diagnosis not present

## 2019-01-11 DIAGNOSIS — R531 Weakness: Secondary | ICD-10-CM | POA: Diagnosis not present

## 2019-01-11 DIAGNOSIS — I2111 ST elevation (STEMI) myocardial infarction involving right coronary artery: Secondary | ICD-10-CM | POA: Diagnosis not present

## 2019-01-11 DIAGNOSIS — R001 Bradycardia, unspecified: Secondary | ICD-10-CM | POA: Diagnosis not present

## 2019-01-11 DIAGNOSIS — R9431 Abnormal electrocardiogram [ECG] [EKG]: Secondary | ICD-10-CM | POA: Diagnosis not present

## 2019-01-11 DIAGNOSIS — I213 ST elevation (STEMI) myocardial infarction of unspecified site: Secondary | ICD-10-CM | POA: Diagnosis not present

## 2019-01-11 DIAGNOSIS — R61 Generalized hyperhidrosis: Secondary | ICD-10-CM | POA: Diagnosis not present

## 2019-01-11 DIAGNOSIS — R079 Chest pain, unspecified: Secondary | ICD-10-CM | POA: Diagnosis not present

## 2019-01-11 DIAGNOSIS — I959 Hypotension, unspecified: Secondary | ICD-10-CM | POA: Diagnosis not present

## 2019-01-11 HISTORY — PX: LEFT HEART CATH: SHX5946

## 2019-01-25 DIAGNOSIS — M7041 Prepatellar bursitis, right knee: Secondary | ICD-10-CM | POA: Diagnosis not present

## 2019-01-25 DIAGNOSIS — M7582 Other shoulder lesions, left shoulder: Secondary | ICD-10-CM | POA: Diagnosis not present

## 2019-01-26 ENCOUNTER — Other Ambulatory Visit: Payer: Self-pay

## 2019-01-26 ENCOUNTER — Encounter: Payer: Self-pay | Admitting: Physical Therapy

## 2019-01-26 ENCOUNTER — Ambulatory Visit: Payer: Medicare HMO | Attending: Orthopedic Surgery | Admitting: Physical Therapy

## 2019-01-26 DIAGNOSIS — M25512 Pain in left shoulder: Secondary | ICD-10-CM | POA: Insufficient documentation

## 2019-01-26 DIAGNOSIS — M25561 Pain in right knee: Secondary | ICD-10-CM | POA: Diagnosis not present

## 2019-01-26 DIAGNOSIS — M25612 Stiffness of left shoulder, not elsewhere classified: Secondary | ICD-10-CM | POA: Diagnosis not present

## 2019-01-26 NOTE — Therapy (Signed)
[place] [place], [place], [address] Phone: [phone]   Fax:  [phone]  Physical Therapy Evaluation  Patient Details  Name: Damon Sanders MRN: [id number] Date of Birth: 1946/04/14 Referring Provider (PT): Noemi Chapel   Encounter Date: 01/26/2019  PT End of Session - 01/26/19 1004    Visit Number  1    Date for PT Re-Evaluation  03/28/19    PT Start Time  0928    PT Stop Time  1010    PT Time Calculation (min)  42 min    Activity Tolerance  Patient tolerated treatment well    Behavior During Therapy  [facility] for tasks assessed/performed       Past Medical History:  Diagnosis Date  . Hyperlipidemia   . Hypertension     Past Surgical History:  Procedure Laterality Date  . THYROIDECTOMY, PARTIAL  1999    There were no vitals filed for this visit.   Subjective Assessment - 01/26/19 0930    Subjective  Patient reports that he has been having some left shoulder pain for a few months.  He reports a cortisone injection about a month ago.  He reports good relief.  In the meantime he had a heart attack, He reports that he really has not done much and is having some left shoulder pain again.  He also has some right knee prepatellar bursitis.  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  Yes    Pain Score  3     Pain Location  Shoulder    Pain Orientation  Left    Pain Descriptors / Indicators  Aching    Pain Type  Acute pain    Pain Radiating Towards  denies    Pain Onset  More than a month ago    Pain Frequency  Constant    Aggravating Factors   reaching up, reaching behind, twisting, worse in the AM pain up to 6/10    Pain Relieving Factors  the injection really helped the  sharp pain, pain at best 2/10    Effect of Pain on Daily Activities  difficulty reaching, difficulty with ADL's         [facility] PT Assessment - 01/26/19 0001      Assessment   Medical Diagnosis  left shoulder pain and right knee pain    Referring Provider (PT)  Noemi Chapel    Onset Date/Surgical Date  01/25/19    Hand Dominance  Left    Prior Therapy  no      Precautions   Precautions  None      Balance Screen   Has the patient fallen in the past 6 months  No    Has the patient had a decrease in activity level because of a fear of falling?   No    Is the patient reluctant to leave their home because of a fear of falling?   No      Home Environment   Additional Comments  stairs, does some housework and yardwork prior to his MI      Prior Function   Level of Independence  Independent    Vocation  Part time employment  Vocation Requirements  electrician, reaching, lifting    Leisure  was riding a bicycle about 15 miles 3x/week,       Posture/Postural Control   Posture Comments  fwd head, rounded shoulders      ROM / Strength   AROM / PROM / Strength  AROM;Strength      AROM   AROM Assessment Site  Shoulder;Knee    Right/Left Shoulder  Left    Left Shoulder Flexion  130 Degrees    Left Shoulder ABduction  130 Degrees    Left Shoulder Internal Rotation  30 Degrees    Left Shoulder External Rotation  60 Degrees    Right/Left Knee  Right    Right Knee Extension  0    Right Knee Flexion  111      Strength   Overall Strength Comments  knee 4/5 with no pain,  left shoulder 3+/5 with pain in the left shoulder area anterior and superior      Palpation   Palpation comment  he is non tender in the mms of the upper trap, he is tender in the left anterior and superior shoulder area      Special Tests   Other special tests  impingement negative, some pain with left empty can test                Objective measurements completed on examination: See above findings.               PT Education - 01/26/19 1004    Education Details  HEP for AAROM to end range all shoulder GH motions    Person(s) Educated  Patient    Methods  Explanation;Demonstration;Other (comment)    Comprehension  Verbalized understanding       PT Short Term Goals - 01/26/19 1035      PT SHORT TERM GOAL #1   Title  independent with initial HEP    Time  2    Period  Weeks    Status  New        PT Long Term Goals - 01/26/19 1036      PT LONG TERM GOAL #1   Title  increase shoulder flexion to 150 degrees    Time  8    Period  Weeks    Status  New      PT LONG TERM GOAL #2   Title  increase shoulder IR to 60 degrees    Time  8    Period  Weeks    Status  New      PT LONG TERM GOAL #3   Title  decrease pain 50%    Time  8    Period  Weeks    Status  New      PT LONG TERM GOAL #4   Title  understand posture and body mechanics    Time  8    Period  Weeks    Status  New             Plan - 01/26/19 1005    Clinical Impression Statement  Patient reports that he has had left shoulder pain for a few months, had a cortisone injection about a month ago with it decreasing his "very sharp pain", he reports that he has difficulty reaching out and behind and difficulty getting up from bed because when he pushes up he has pain.  He did have a heart attack on 01/11/19.  He reports being very inactive  since that time.  He has limited ROM, IR is the most limited.  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, he also has a right kne bursitis diagnosis but his knee seemed fine and he did not want me to do much with this today    Stability/Clinical Decision Making  Stable/Uncomplicated    Clinical Decision Making  Low    Rehab Potential  Good    PT Frequency  2x / week    PT Duration  8 weeks    PT Treatment/Interventions  ADLs/Self Care Home Management;Cryotherapy;Electrical Stimulation;Iontophoresis 4mg /ml Dexamethasone;Moist Heat;Ultrasound;Therapeutic activities;Therapeutic  exercise;Patient/family education;Manual techniques    PT Next Visit Plan  slowly start stability exercises    Consulted and Agree with Plan of Care  Patient       Patient will benefit from skilled therapeutic intervention in order to improve the following deficits and impairments:  Pain, Improper body mechanics, Postural dysfunction, Decreased range of motion, Decreased strength, Impaired UE functional use  Visit Diagnosis: Acute pain of left shoulder - Plan: PT plan of care cert/re-cert  Stiffness of left shoulder, not elsewhere classified - Plan: PT plan of care cert/re-cert  Acute pain of right knee - Plan: PT plan of care cert/re-cert     Problem List There are no active problems to display for this patient.   Sumner Boast., PT 01/26/2019, 10:39 AM  [address] [place], [place], [address] Phone: [phone]   Fax:  [phone]  Name: Damon Sanders MRN: [id number] Date of Birth: Dec 29, 1945

## 2019-01-31 ENCOUNTER — Ambulatory Visit: Payer: Medicare HMO | Admitting: Physical Therapy

## 2019-01-31 ENCOUNTER — Encounter: Payer: Self-pay | Admitting: Physical Therapy

## 2019-01-31 ENCOUNTER — Other Ambulatory Visit: Payer: Self-pay

## 2019-01-31 DIAGNOSIS — I2510 Atherosclerotic heart disease of native coronary artery without angina pectoris: Secondary | ICD-10-CM | POA: Diagnosis not present

## 2019-01-31 DIAGNOSIS — I252 Old myocardial infarction: Secondary | ICD-10-CM | POA: Diagnosis not present

## 2019-01-31 DIAGNOSIS — I10 Essential (primary) hypertension: Secondary | ICD-10-CM | POA: Diagnosis not present

## 2019-01-31 DIAGNOSIS — M25512 Pain in left shoulder: Secondary | ICD-10-CM

## 2019-01-31 DIAGNOSIS — E785 Hyperlipidemia, unspecified: Secondary | ICD-10-CM | POA: Diagnosis not present

## 2019-01-31 DIAGNOSIS — M25561 Pain in right knee: Secondary | ICD-10-CM | POA: Diagnosis not present

## 2019-01-31 DIAGNOSIS — M25612 Stiffness of left shoulder, not elsewhere classified: Secondary | ICD-10-CM | POA: Diagnosis not present

## 2019-01-31 NOTE — Therapy (Signed)
[place], [place], [address] Phone: [phone]   Fax:  [phone]  Physical Therapy Treatment  Patient Details  Name: RAMAN FEATHERSTON MRN: [id number] Date of Birth: [birth year] Referring Provider (PT): Noemi Chapel   Encounter Date: 01/31/2019  PT End of Session - 01/31/19 1443    Visit Number  2    Date for PT Re-Evaluation  03/28/19    PT Start Time  1400    PT Stop Time  1445    PT Time Calculation (min)  45 min    Activity Tolerance  Patient tolerated treatment well    Behavior During Therapy  [facility] for tasks assessed/performed       Past Medical History:  Diagnosis Date  . Hyperlipidemia   . Hypertension     Past Surgical History:  Procedure Laterality Date  . THYROIDECTOMY, PARTIAL  1999    There were no vitals filed for this visit.  Subjective Assessment - 01/31/19 1402    Subjective  Pt reports that he is doing ok, He still reports some pain in L shoulder    Currently in Pain?  Yes    Pain Score  3     Pain Location  Shoulder    Pain Orientation  Left                       [facility] Adult PT Treatment/Exercise - 01/31/19 0001      Exercises   Exercises  Shoulder      Shoulder Exercises: Supine   Other Supine Exercises  L shoulder 2lb ER/IR x 10 each      Shoulder Exercises: Standing   External Rotation  Theraband;20 reps;Left    Theraband Level (Shoulder External Rotation)  Level 2 (Red)    Internal Rotation  Theraband;20 reps;Left;Strengthening    Theraband Level (Shoulder Internal Rotation)  Level 2 (Red)    Flexion  Weights;20 reps;Both    Shoulder Flexion Weight (lbs)  3    ABduction  Theraband;20 reps;Both;Strengthening    Shoulder ABduction Weight (lbs)  3    Extension  20 reps;Theraband;Both;Strengthening    Theraband Level (Shoulder Extension)  Level 2 (Red)    Row  Theraband;20 reps;Both;Strengthening    Theraband Level (Shoulder Row)  Level 2  (Red)    Other Standing Exercises  Wall push ups 2x10       Shoulder Exercises: ROM/Strengthening   UBE (Upper Arm Bike)  L3 x86min each     Nustep  L4 x 5 min       Manual Therapy   Manual Therapy  Passive ROM;Soft tissue mobilization;Joint mobilization    Joint Mobilization  GH jt graded 2-3    Soft tissue mobilization  anterior and lateral L deltoid    Passive ROM  L shoulder all directions.               PT Short Term Goals - 01/26/19 1035      PT SHORT TERM GOAL #1   Title  independent with initial HEP    Time  2    Period  Weeks    Status  New        PT Long Term Goals - 01/26/19 1036      PT LONG TERM GOAL #1   Title  increase shoulder flexion to 150 degrees    Time  8    Period  Weeks    Status  New      PT LONG TERM GOAL #2   Title  increase shoulder IR to 60 degrees    Time  8    Period  Weeks    Status  New      PT LONG TERM GOAL #3   Title  decrease pain 50%    Time  8    Period  Weeks    Status  New      PT LONG TERM GOAL #4   Title  understand posture and body mechanics    Time  8    Period  Weeks    Status  New            Plan - 01/31/19 1444    Clinical Impression Statement  Pt tolerated an initial progression to TE well. Postural cues needed for standing rows and extensions. Tactile cues needed to keep arm in proper placement with external rotation. Good overall PROM, L shoulder does protrude forward with Internal rotation. L shoulder protrusion did lessen after jt mobes.    Stability/Clinical Decision Making  Stable/Uncomplicated    Rehab Potential  Good    PT Frequency  2x / week    PT Duration  8 weeks    PT Treatment/Interventions  ADLs/Self Care Home Management;Cryotherapy;Electrical Stimulation;Iontophoresis 4mg /ml Dexamethasone;Moist Heat;Ultrasound;Therapeutic activities;Therapeutic exercise;Patient/family education;Manual techniques    PT Next Visit Plan  slowly start stability exercises       Patient will benefit  from skilled therapeutic intervention in order to improve the following deficits and impairments:  Pain, Improper body mechanics, Postural dysfunction, Decreased range of motion, Decreased strength, Impaired UE functional use  Visit Diagnosis: 1. Acute pain of right knee   2. Stiffness of left shoulder, not elsewhere classified   3. Acute pain of left shoulder        Problem List There are no active problems to display for this patient.   Scot Jun, PTA 01/31/2019, 2:47 PM  [place], [place], [address] Phone: [phone]   Fax:  [phone]  Name: KLEBER CREAN MRN: [id number] Date of Birth: Dec 08, 1945

## 2019-02-06 ENCOUNTER — Ambulatory Visit: Payer: Medicare HMO

## 2019-02-06 ENCOUNTER — Ambulatory Visit: Payer: Medicare HMO | Admitting: Physical Therapy

## 2019-02-06 ENCOUNTER — Encounter: Payer: Self-pay | Admitting: Physical Therapy

## 2019-02-06 ENCOUNTER — Other Ambulatory Visit: Payer: Self-pay

## 2019-02-06 DIAGNOSIS — M25561 Pain in right knee: Secondary | ICD-10-CM | POA: Diagnosis not present

## 2019-02-06 DIAGNOSIS — M25612 Stiffness of left shoulder, not elsewhere classified: Secondary | ICD-10-CM | POA: Diagnosis not present

## 2019-02-06 DIAGNOSIS — M25512 Pain in left shoulder: Secondary | ICD-10-CM | POA: Diagnosis not present

## 2019-02-06 DIAGNOSIS — I2111 ST elevation (STEMI) myocardial infarction involving right coronary artery: Secondary | ICD-10-CM | POA: Diagnosis not present

## 2019-02-06 NOTE — Therapy (Signed)
[place], [place], [address] Phone: [phone]   Fax:  [phone]  Physical Therapy Treatment  Patient Details  Name: Damon Sanders MRN: [id number] Date of Birth: 08/22/1945 Referring Provider (PT): Noemi Chapel   Encounter Date: 02/06/2019  PT End of Session - 02/06/19 1524    Visit Number  3    Date for PT Re-Evaluation  03/28/19    PT Start Time  2992    PT Stop Time  1525    PT Time Calculation (min)  46 min    Activity Tolerance  Patient tolerated treatment well    Behavior During Therapy  [facility] for tasks assessed/performed       Past Medical History:  Diagnosis Date  . Hyperlipidemia   . Hypertension     Past Surgical History:  Procedure Laterality Date  . THYROIDECTOMY, PARTIAL  1999    There were no vitals filed for this visit.  Subjective Assessment - 02/06/19 1444    Subjective  Patient report sthat  he is still hurting    Currently in Pain?  Yes    Pain Score  3     Pain Location  Shoulder    Pain Orientation  Left    Pain Relieving Factors  reaching out         [facility] PT Assessment - 02/06/19 0001      AROM   Left Shoulder Flexion  150 Degrees    Left Shoulder Internal Rotation  45 Degrees                   [facility] Adult PT Treatment/Exercise - 02/06/19 0001      Shoulder Exercises: Standing   External Rotation  Theraband;20 reps;Left    Theraband Level (Shoulder External Rotation)  Level 2 (Red)    Internal Rotation  Theraband;20 reps;Left;Strengthening    Theraband Level (Shoulder Internal Rotation)  Level 2 (Red)    Extension  20 reps;Theraband;Both;Strengthening    Theraband Level (Shoulder Extension)  Level 2 (Red)    Row  Theraband;20 reps;Both;Strengthening    Theraband Level (Shoulder Row)  Level 2 (Red)    Other Standing Exercises  Wall push ups 2x10 , weighted ball throwing    Other Standing Exercises  back to wall overhead weight lift       Shoulder Exercises: ROM/Strengthening   Nustep  L4 x 5 min       Manual Therapy   Manual Therapy  Passive ROM;Soft tissue mobilization;Joint mobilization    Joint Mobilization  GH jt graded 2-3    Soft tissue mobilization  anterior and lateral L deltoid    Passive ROM  L shoulder all directions.               PT Short Term Goals - 01/26/19 1035      PT SHORT TERM GOAL #1   Title  independent with initial HEP    Time  2    Period  Weeks    Status  New        PT Long Term Goals - 02/06/19 1527      PT LONG TERM GOAL #1   Title  increase shoulder flexion to 150 degrees    Status  On-going      PT LONG TERM GOAL #2   Title  increase shoulder IR to 60 degrees    Status  On-going  Plan - 02/06/19 1525    Clinical Impression Statement  Patient with increased ROM since the evaluation, he is still with some pain, has significant swelling on top of the patella, he is putting cream on it and we discussed the use of the ionto patch, he was unsure due to the cortisone injection in the shoulder recently, so we elected to not do it today    PT Next Visit Plan  continue to increase as tolerated    Consulted and Agree with Plan of Care  Patient       Patient will benefit from skilled therapeutic intervention in order to improve the following deficits and impairments:  Pain, Improper body mechanics, Postural dysfunction, Decreased range of motion, Decreased strength, Impaired UE functional use  Visit Diagnosis: 1. Acute pain of right knee   2. Stiffness of left shoulder, not elsewhere classified   3. Acute pain of left shoulder        Problem List There are no active problems to display for this patient.   Sumner Boast., PT 02/06/2019, 3:28 PM  [place] [address] [place], [place], [address] Phone: [phone]   Fax:  [phone]  Name: Damon Sanders MRN:  [id number] Date of Birth: 08-Nov-1945

## 2019-02-09 ENCOUNTER — Encounter: Payer: Self-pay | Admitting: Physical Therapy

## 2019-02-09 ENCOUNTER — Ambulatory Visit: Payer: Medicare HMO | Admitting: Physical Therapy

## 2019-02-09 ENCOUNTER — Other Ambulatory Visit: Payer: Self-pay

## 2019-02-09 DIAGNOSIS — M25512 Pain in left shoulder: Secondary | ICD-10-CM | POA: Diagnosis not present

## 2019-02-09 DIAGNOSIS — M25612 Stiffness of left shoulder, not elsewhere classified: Secondary | ICD-10-CM

## 2019-02-09 DIAGNOSIS — M25561 Pain in right knee: Secondary | ICD-10-CM | POA: Diagnosis not present

## 2019-02-09 NOTE — Therapy (Signed)
[place], [place], [address] Phone: [phone]   Fax:  [phone]  Physical Therapy Treatment  Patient Details  Name: Damon Sanders MRN: [id number] Date of Birth: [birth year] Referring Provider (PT): Noemi Chapel   Encounter Date: 02/09/2019  PT End of Session - 02/09/19 0946    Visit Number  4    Date for PT Re-Evaluation  03/28/19    PT Start Time  0900    PT Stop Time  0943    PT Time Calculation (min)  43 min    Activity Tolerance  Patient tolerated treatment well    Behavior During Therapy  [facility] for tasks assessed/performed       Past Medical History:  Diagnosis Date  . Hyperlipidemia   . Hypertension     Past Surgical History:  Procedure Laterality Date  . THYROIDECTOMY, PARTIAL  1999    There were no vitals filed for this visit.  Subjective Assessment - 02/09/19 0855    Subjective  "I guess I feel ok, shoulder still hurts every now and then"    Currently in Pain?  No/denies                       [facility] Adult PT Treatment/Exercise - 02/09/19 0001      Shoulder Exercises: Supine   Other Supine Exercises  L shoulder 3lb ER/IR x 15 each      Shoulder Exercises: Standing   External Rotation  Theraband;20 reps;Left    Theraband Level (Shoulder External Rotation)  Level 2 (Red)    Internal Rotation  Theraband;20 reps;Left;Strengthening    Theraband Level (Shoulder Internal Rotation)  Level 2 (Red)    Extension  20 reps;Theraband;Both;Strengthening    Theraband Level (Shoulder Extension)  Level 3 (Green)    Row  Theraband;20 reps;Both;Strengthening    Theraband Level (Shoulder Row)  Level 3 (Green)    Other Standing Exercises  Wall push ups 2x10 , weighted ball throwing    Other Standing Exercises  back to wall overhead weight lift      Shoulder Exercises: ROM/Strengthening   UBE (Upper Arm Bike)  L2  x66min each     Nustep  L5x 4 min     Other  ROM/Strengthening Exercises  Rows and Lats 20lb 2x10       Manual Therapy   Manual Therapy  Passive ROM;Soft tissue mobilization;Joint mobilization    Joint Mobilization  GH jt graded 2-3    Soft tissue mobilization  anterior and lateral L deltoid    Passive ROM  L shoulder all directions.               PT Short Term Goals - 01/26/19 1035      PT SHORT TERM GOAL #1   Title  independent with initial HEP    Time  2    Period  Weeks    Status  New        PT Long Term Goals - 02/06/19 1527      PT LONG TERM GOAL #1   Title  increase shoulder flexion to 150 degrees    Status  On-going      PT LONG TERM GOAL #2   Title  increase shoulder IR to 60 degrees    Status  On-going            Plan - 02/09/19 0946    Clinical Impression Statement  Elected not  to try to the ionto patch due to cortisone injection concerns. Progressed to some more intense exercise interventions without issue. Tactile cues needed for proper arm placement with resisted internal and external rotation. PT has full L shoulder PROM with some tightness with IR;.    Stability/Clinical Decision Making  Stable/Uncomplicated    Rehab Potential  Good    PT Frequency  2x / week    PT Duration  8 weeks    PT Treatment/Interventions  ADLs/Self Care Home Management;Cryotherapy;Electrical Stimulation;Iontophoresis 4mg /ml Dexamethasone;Moist Heat;Ultrasound;Therapeutic activities;Therapeutic exercise;Patient/family education;Manual techniques    PT Next Visit Plan  continue to increase as tolerated       Patient will benefit from skilled therapeutic intervention in order to improve the following deficits and impairments:  Pain, Improper body mechanics, Postural dysfunction, Decreased range of motion, Decreased strength, Impaired UE functional use  Visit Diagnosis: 1. Acute pain of left shoulder   2. Stiffness of left shoulder, not elsewhere classified        Problem List There are no active problems  to display for this patient.   Scot Jun, PTA 02/09/2019, 9:49 AM  [place], [place], [address] Phone: [phone]   Fax:  [phone]  Name: Damon Sanders MRN: [id number] Date of Birth: Jan 10, 1946

## 2019-02-13 ENCOUNTER — Ambulatory Visit: Payer: Medicare HMO | Admitting: Physical Therapy

## 2019-02-13 ENCOUNTER — Encounter: Payer: Self-pay | Admitting: Physical Therapy

## 2019-02-13 ENCOUNTER — Other Ambulatory Visit: Payer: Self-pay

## 2019-02-13 DIAGNOSIS — M25561 Pain in right knee: Secondary | ICD-10-CM

## 2019-02-13 DIAGNOSIS — M25512 Pain in left shoulder: Secondary | ICD-10-CM | POA: Diagnosis not present

## 2019-02-13 DIAGNOSIS — M25612 Stiffness of left shoulder, not elsewhere classified: Secondary | ICD-10-CM | POA: Diagnosis not present

## 2019-02-13 NOTE — Therapy (Signed)
[place], [place], [address] Phone: [phone]   Fax:  [phone]  Physical Therapy Treatment  Patient Details  Name: AZLAN HANWAY MRN: [id number] Date of Birth: May 09, 1946 Referring Provider (PT): Noemi Chapel   Encounter Date: 02/13/2019  PT End of Session - 02/13/19 0942    Visit Number  5    Date for PT Re-Evaluation  03/28/19    PT Start Time  8366    PT Stop Time  0942    PT Time Calculation (min)  45 min    Activity Tolerance  Patient tolerated treatment well    Behavior During Therapy  [facility] for tasks assessed/performed       Past Medical History:  Diagnosis Date  . Hyperlipidemia   . Hypertension     Past Surgical History:  Procedure Laterality Date  . THYROIDECTOMY, PARTIAL  1999    There were no vitals filed for this visit.  Subjective Assessment - 02/13/19 0858    Subjective  "I feel fine, some days it hurts some days it don't"    Currently in Pain?  No/denies                       [facility] Adult PT Treatment/Exercise - 02/13/19 0001      Shoulder Exercises: Supine   Other Supine Exercises  L shoulder 3lb ER/IR x 15 each      Shoulder Exercises: Standing   External Rotation  Theraband;Left;15 reps;Strengthening   x2   Theraband Level (Shoulder External Rotation)  Level 2 (Red)    Internal Rotation  Theraband;15 reps;Left;Strengthening   x2   Theraband Level (Shoulder Internal Rotation)  Level 2 (Red)    Flexion  Weights;20 reps;Both    Shoulder Flexion Weight (lbs)  3    ABduction  Theraband;20 reps;Both;Strengthening    Shoulder ABduction Weight (lbs)  3    Extension  20 reps;Theraband;Both;Strengthening    Theraband Level (Shoulder Extension)  Level 4 (Blue)    Row  Theraband;20 reps;Both;Strengthening    Theraband Level (Shoulder Row)  Level 4 (Blue)    Other Standing Exercises  OHP 4lb 2x10       Shoulder Exercises: Therapy Ball   Other  Therapy Ball Exercises  3 level cabinet reaches 3lb x 10 flex and abd      Shoulder Exercises: ROM/Strengthening   UBE (Upper Arm Bike)  L2  x16min each     Other ROM/Strengthening Exercises  Rows and Lats 25lb 2x10     Other ROM/Strengthening Exercises  chest Press 20lb 2x10       Manual Therapy   Manual Therapy  Passive ROM;Soft tissue mobilization;Joint mobilization    Joint Mobilization  GH jt graded 2-3    Soft tissue mobilization  anterior and lateral L deltoid    Passive ROM  L shoulder all directions.               PT Short Term Goals - 01/26/19 1035      PT SHORT TERM GOAL #1   Title  independent with initial HEP    Time  2    Period  Weeks    Status  New        PT Long Term Goals - 02/06/19 1527      PT LONG TERM GOAL #1   Title  increase shoulder flexion to 150 degrees    Status  On-going  PT LONG TERM GOAL #2   Title  increase shoulder IR to 60 degrees    Status  On-going            Plan - 02/13/19 0946    Clinical Impression Statement  Overall pt tolerated today's interventions well. Some weakness reported with three level cabinet reaches. Some pain reported with standing shoulder abduction and overhead presses. increase weight tolerated with rows and lats. L shoulder passive IR remains tight with some improvement after jt mobs.    Stability/Clinical Decision Making  Stable/Uncomplicated    Rehab Potential  Good    PT Frequency  2x / week    PT Treatment/Interventions  ADLs/Self Care Home Management;Cryotherapy;Electrical Stimulation;Iontophoresis 4mg /ml Dexamethasone;Moist Heat;Ultrasound;Therapeutic activities;Therapeutic exercise;Patient/family education;Manual techniques    PT Next Visit Plan  continue to increase as tolerated       Patient will benefit from skilled therapeutic intervention in order to improve the following deficits and impairments:  Pain, Improper body mechanics, Postural dysfunction, Decreased range of motion,  Decreased strength, Impaired UE functional use  Visit Diagnosis: 1. Acute pain of left shoulder   2. Stiffness of left shoulder, not elsewhere classified   3. Acute pain of right knee        Problem List There are no active problems to display for this patient.   Scot Jun, PTA 02/13/2019, 9:48 AM  [place], [place], [address] Phone: [phone]   Fax:  [phone]  Name: TARYLL REICHENBERGER MRN: [id number] Date of Birth: [birth year]

## 2019-02-14 DIAGNOSIS — Z87891 Personal history of nicotine dependence: Secondary | ICD-10-CM | POA: Diagnosis not present

## 2019-02-14 DIAGNOSIS — I2111 ST elevation (STEMI) myocardial infarction involving right coronary artery: Secondary | ICD-10-CM | POA: Diagnosis not present

## 2019-02-15 DIAGNOSIS — I2111 ST elevation (STEMI) myocardial infarction involving right coronary artery: Secondary | ICD-10-CM | POA: Diagnosis not present

## 2019-02-15 DIAGNOSIS — Z87891 Personal history of nicotine dependence: Secondary | ICD-10-CM | POA: Diagnosis not present

## 2019-02-16 ENCOUNTER — Other Ambulatory Visit: Payer: Self-pay

## 2019-02-16 ENCOUNTER — Encounter: Payer: Self-pay | Admitting: Physical Therapy

## 2019-02-16 ENCOUNTER — Ambulatory Visit: Payer: Medicare HMO | Attending: Orthopedic Surgery | Admitting: Physical Therapy

## 2019-02-16 DIAGNOSIS — M25561 Pain in right knee: Secondary | ICD-10-CM | POA: Diagnosis not present

## 2019-02-16 DIAGNOSIS — M25612 Stiffness of left shoulder, not elsewhere classified: Secondary | ICD-10-CM | POA: Diagnosis not present

## 2019-02-16 DIAGNOSIS — M25512 Pain in left shoulder: Secondary | ICD-10-CM | POA: Diagnosis not present

## 2019-02-16 NOTE — Therapy (Signed)
[place], [place], [address] Phone: [phone]   Fax:  [phone]  Physical Therapy Treatment  Patient Details  Name: Damon Sanders MRN: [id number] Date of Birth: 1946/08/04 Referring Provider (PT): Noemi Chapel   Encounter Date: 02/16/2019  PT End of Session - 02/16/19 1012    Visit Number  6    Date for PT Re-Evaluation  03/28/19    PT Start Time  0925    PT Stop Time  1020    PT Time Calculation (min)  55 min    Activity Tolerance  Patient tolerated treatment well    Behavior During Therapy  [facility] for tasks assessed/performed       Past Medical History:  Diagnosis Date  . Hyperlipidemia   . Hypertension     Past Surgical History:  Procedure Laterality Date  . THYROIDECTOMY, PARTIAL  1999    There were no vitals filed for this visit.  Subjective Assessment - 02/16/19 0925    Subjective  "All right"    Pertinent History  Heart attack    Limitations  Lifting;Standing;Walking;House hold activities    Patient Stated Goals  have less pain and better motions    Currently in Pain?  No/denies         [facility] PT Assessment - 02/16/19 0001      AROM   Left Shoulder Flexion  180 Degrees    Left Shoulder ABduction  180 Degrees    Left Shoulder Internal Rotation  46 Degrees    Left Shoulder External Rotation  90 Degrees    Right Knee Extension  0    Right Knee Flexion  119                   [facility] Adult PT Treatment/Exercise - 02/16/19 0001      Shoulder Exercises: Supine   Other Supine Exercises  L shoulder 3lb ER/IR x 15 each      Shoulder Exercises: Standing   External Rotation  Theraband;Left;15 reps;Strengthening    Theraband Level (Shoulder External Rotation)  Level 2 (Red)   x2   Internal Rotation  Theraband;15 reps;Left;Strengthening   x2   Theraband Level (Shoulder Internal Rotation)  Level 2 (Red)    Flexion  20 reps;Both;Theraband    Theraband Level  (Shoulder Flexion)  Level 2 (Red)    ABduction  Theraband;20 reps;Both;Strengthening    Theraband Level (Shoulder ABduction)  Level 2 (Red)    Other Standing Exercises  Tricep ext 25lb 2x10, Biceps curls 20lb 2x10      Shoulder Exercises: Therapy Ball   Other Therapy Ball Exercises  3 level cabinet reaches 3lb x 10 flex and abd      Shoulder Exercises: ROM/Strengthening   UBE (Upper Arm Bike)  L3.5  x2 min each     Nustep  L4x 5 min     Other ROM/Strengthening Exercises  Rows and Lats 35lb 2x10     Other ROM/Strengthening Exercises  chest Press 25lb 2x10       Modalities   Modalities  Moist Heat      Moist Heat Therapy   Number Minutes Moist Heat  10 Minutes    Moist Heat Location  Shoulder      Manual Therapy   Manual Therapy  Passive ROM;Soft tissue mobilization;Joint mobilization    Joint Mobilization  [place] jt graded 2-3    Soft tissue mobilization  anterior and lateral L deltoid  Passive ROM  L shoulder all directions.               PT Short Term Goals - 01/26/19 1035      PT SHORT TERM GOAL #1   Title  independent with initial HEP    Time  2    Period  Weeks    Status  New        PT Long Term Goals - 02/16/19 3241      PT LONG TERM GOAL #1   Title  increase shoulder flexion to 150 degrees    Status  Achieved      PT LONG TERM GOAL #2   Title  increase shoulder IR to 60 degrees    Status  On-going      PT LONG TERM GOAL #3   Title  decrease pain 50%    Status  Partially Met      PT LONG TERM GOAL #4   Title  understand posture and body mechanics    Status  Partially Met            Plan - 02/16/19 1013    Clinical Impression Statement  Pt has progressed increasing his L shoulder AROM. Increase weight tolerated with seated rows and lats but cues needed not to allow shoulder to protrude forward. Tactile cues needed with Triceps ext and shoulder ER to maintain good arm placement. Again tightness with passive IR but that improved after JT mons.  He did report some lateral shoulder pain with IR.    Stability/Clinical Decision Making  Stable/Uncomplicated    Rehab Potential  Good    PT Frequency  2x / week    PT Treatment/Interventions  ADLs/Self Care Home Management;Cryotherapy;Electrical Stimulation;Iontophoresis 37m/ml Dexamethasone;Moist Heat;Ultrasound;Therapeutic activities;Therapeutic exercise;Patient/family education;Manual techniques    PT Next Visit Plan  L shoulder internal rotation flexability       Patient will benefit from skilled therapeutic intervention in order to improve the following deficits and impairments:  Pain, Improper body mechanics, Postural dysfunction, Decreased range of motion, Decreased strength, Impaired UE functional use  Visit Diagnosis: 1. Stiffness of left shoulder, not elsewhere classified   2. Acute pain of left shoulder   3. Acute pain of right knee        Problem List There are no active problems to display for this patient.   RScot Jun7/10/2018, 10:17 AM  [address] [address]Phone: [phone]  Fax:  [phone] Name: Damon MCFERRANMRN: [id number] of Birth: 128-Jul-1947

## 2019-02-19 DIAGNOSIS — H401111 Primary open-angle glaucoma, right eye, mild stage: Secondary | ICD-10-CM | POA: Diagnosis not present

## 2019-02-19 DIAGNOSIS — H401122 Primary open-angle glaucoma, left eye, moderate stage: Secondary | ICD-10-CM | POA: Diagnosis not present

## 2019-02-19 DIAGNOSIS — Z87891 Personal history of nicotine dependence: Secondary | ICD-10-CM | POA: Diagnosis not present

## 2019-02-19 DIAGNOSIS — I2111 ST elevation (STEMI) myocardial infarction involving right coronary artery: Secondary | ICD-10-CM | POA: Diagnosis not present

## 2019-02-19 DIAGNOSIS — H2513 Age-related nuclear cataract, bilateral: Secondary | ICD-10-CM | POA: Diagnosis not present

## 2019-02-20 ENCOUNTER — Encounter: Payer: Self-pay | Admitting: Physical Therapy

## 2019-02-20 ENCOUNTER — Other Ambulatory Visit: Payer: Self-pay

## 2019-02-20 ENCOUNTER — Ambulatory Visit: Payer: Medicare HMO | Admitting: Physical Therapy

## 2019-02-20 DIAGNOSIS — M25512 Pain in left shoulder: Secondary | ICD-10-CM | POA: Diagnosis not present

## 2019-02-20 DIAGNOSIS — M25612 Stiffness of left shoulder, not elsewhere classified: Secondary | ICD-10-CM

## 2019-02-20 DIAGNOSIS — M25561 Pain in right knee: Secondary | ICD-10-CM

## 2019-02-20 NOTE — Therapy (Signed)
[facility] [address] [place], [place], [address] Phone: [phone]   Fax:  [phone]  Physical Therapy Treatment  Patient Details  Name: Damon Sanders MRN: [id number] Date of Birth: 01/27/1946 Referring Provider (PT): Wainer   Encounter Date: 02/20/2019  PT End of Session - 02/20/19 1513    Visit Number  7    Date for PT Re-Evaluation  03/28/19    PT Start Time  1430    PT Stop Time  1515    PT Time Calculation (min)  45 min    Activity Tolerance  Patient tolerated treatment well    Behavior During Therapy  [facility] for tasks assessed/performed       Past Medical History:  Diagnosis Date  . Hyperlipidemia   . Hypertension     Past Surgical History:  Procedure Laterality Date  . THYROIDECTOMY, PARTIAL  1999    There were no vitals filed for this visit.  Subjective Assessment - 02/20/19 1437    Subjective  "it is going, but it sill hurt a little"    Currently in Pain?  No/denies    Pain Score  0-No pain                       [facility] Adult PT Treatment/Exercise - 02/20/19 0001      Shoulder Exercises: Supine   Other Supine Exercises  L shoulder 3lb ER/IR x 15 each      Shoulder Exercises: Seated   Other Seated Exercises  bent over rows, ext, rev flys 3lb x10       Shoulder Exercises: Standing   External Rotation  Theraband;Left;15 reps;Strengthening    Theraband Level (Shoulder External Rotation)  Level 3 (Green)    Internal Rotation  Theraband;15 reps;Left;Strengthening    Theraband Level (Shoulder Internal Rotation)  Level 3 (Green)    Flexion  20 reps;Both;Theraband    Shoulder Flexion Weight (lbs)  4    ABduction  Theraband;20 reps;Both;Strengthening    Shoulder ABduction Weight (lbs)  4    Extension  20 reps;Both;Strengthening;Weights    Extension Weight (lbs)  10    Other Standing Exercises  OHP 4lb 2x10     Other Standing Exercises  Tricep ext 25lb 2x10, Biceps curls  20lb 2x10      Shoulder Exercises: ROM/Strengthening   UBE (Upper Arm Bike)  L3.5  x3 min each     Nustep         Manual Therapy   Manual Therapy  Passive ROM;Soft tissue mobilization;Joint mobilization    Joint Mobilization  GH jt graded 2-3    Soft tissue mobilization  anterior and lateral L deltoid    Passive ROM  L shoulder all directions.               PT Short Term Goals - 01/26/19 1035      PT SHORT TERM GOAL #1   Title  independent with initial HEP    Time  2    Period  Weeks    Status  New        PT Long Term Goals - 02/16/19 0938      PT LONG TERM GOAL #1   Title  increase shoulder flexion to 150 degrees    Status  Achieved      PT LONG TERM GOAL #2   Title  increase shoulder IR to 60 degrees    Status  On-going        PT LONG TERM GOAL #3   Title  decrease pain 50%    Status  Partially Met      PT LONG TERM GOAL #4   Title  understand posture and body mechanics    Status  Partially Met            Plan - 02/20/19 1513    Clinical Impression Statement  Good strength and ROM throughout session with all interventions. He reports  no functional limitations on some L shoulder pain at times. Postural cues needed with tricept extensons. Tactile cues provided for bent over exercises. Some tightness with passive ER/IR    Stability/Clinical Decision Making  Stable/Uncomplicated    Rehab Potential  Good    PT Frequency  2x / week    PT Duration  8 weeks    PT Treatment/Interventions  ADLs/Self Care Home Management;Cryotherapy;Electrical Stimulation;Iontophoresis 4mg/ml Dexamethasone;Moist Heat;Ultrasound;Therapeutic activities;Therapeutic exercise;Patient/family education;Manual techniques    PT Next Visit Plan  L shoulder internal rotation flexability       Patient will benefit from skilled therapeutic intervention in order to improve the following deficits and impairments:  Pain, Improper body mechanics, Postural dysfunction, Decreased range of  motion, Decreased strength, Impaired UE functional use  Visit Diagnosis: 1. Acute pain of left shoulder   2. Acute pain of right knee   3. Stiffness of left shoulder, not elsewhere classified        Problem List There are no active problems to display for this patient.   Ronald G Pemberton, PTA 02/20/2019, 3:15 PM  [facility] [address] [place], [place], [address] Phone: [phone]   Fax:  [phone]  Name: Damon Sanders MRN: [id number] Date of Birth: 01/14/1946   

## 2019-02-21 DIAGNOSIS — I2111 ST elevation (STEMI) myocardial infarction involving right coronary artery: Secondary | ICD-10-CM | POA: Diagnosis not present

## 2019-02-21 DIAGNOSIS — Z87891 Personal history of nicotine dependence: Secondary | ICD-10-CM | POA: Diagnosis not present

## 2019-02-22 DIAGNOSIS — I2111 ST elevation (STEMI) myocardial infarction involving right coronary artery: Secondary | ICD-10-CM | POA: Diagnosis not present

## 2019-02-22 DIAGNOSIS — Z87891 Personal history of nicotine dependence: Secondary | ICD-10-CM | POA: Diagnosis not present

## 2019-02-23 ENCOUNTER — Ambulatory Visit: Payer: Medicare HMO | Admitting: Physical Therapy

## 2019-02-23 ENCOUNTER — Other Ambulatory Visit: Payer: Self-pay

## 2019-02-23 ENCOUNTER — Encounter: Payer: Self-pay | Admitting: Physical Therapy

## 2019-02-23 DIAGNOSIS — M25512 Pain in left shoulder: Secondary | ICD-10-CM

## 2019-02-23 DIAGNOSIS — M25561 Pain in right knee: Secondary | ICD-10-CM | POA: Diagnosis not present

## 2019-02-23 DIAGNOSIS — M25612 Stiffness of left shoulder, not elsewhere classified: Secondary | ICD-10-CM | POA: Diagnosis not present

## 2019-02-23 NOTE — Therapy (Signed)
[place], [place], [address] Phone: [phone]   Fax:  [phone]  Physical Therapy Treatment  Patient Details  Name: Damon Sanders MRN: [id number] Date of Birth: 1946-03-18 Referring Provider (PT): Noemi Chapel   Encounter Date: 02/23/2019  PT End of Session - 02/23/19 1016    Visit Number  8    Date for PT Re-Evaluation  03/28/19    PT Start Time  0925    PT Stop Time  1021    PT Time Calculation (min)  56 min       Past Medical History:  Diagnosis Date  . Hyperlipidemia   . Hypertension     Past Surgical History:  Procedure Laterality Date  . THYROIDECTOMY, PARTIAL  1999    There were no vitals filed for this visit.  Subjective Assessment - 02/23/19 0926    Subjective  "Shoulder is all right kinda stiff when you get up early in the morning"    Pertinent History  Heart attack    Limitations  Lifting;Standing;Walking;House hold activities    Patient Stated Goals  have less pain and better motions    Currently in Pain?  No/denies         [facility] PT Assessment - 02/23/19 0001      AROM   Left Shoulder Internal Rotation  47 Degrees                   [facility] Adult PT Treatment/Exercise - 02/23/19 0001      Shoulder Exercises: Standing   Extension  20 reps;Both;Strengthening;Weights    Extension Weight (lbs)  15    Other Standing Exercises  OHP 4lb 2x10       Shoulder Exercises: ROM/Strengthening   UBE (Upper Arm Bike)  L3.5  x3 min each     Other ROM/Strengthening Exercises  Rows and Lats 35lb 2x10     Other ROM/Strengthening Exercises  chest Press 25lb 2x10       Shoulder Exercises: Stretch   Other Shoulder Stretches  LUE towell stretch 5X10''       Modalities   Modalities  Moist Heat      Moist Heat Therapy   Number Minutes Moist Heat  10 Minutes    Moist Heat Location  Shoulder      Manual Therapy   Manual Therapy  Passive ROM;Soft tissue  mobilization;Joint mobilization    Joint Mobilization  GH jt graded 2-3    Passive ROM  L shoulder all directions, mostly IR                PT Short Term Goals - 01/26/19 1035      PT SHORT TERM GOAL #1   Title  independent with initial HEP    Time  2    Period  Weeks    Status  New        PT Long Term Goals - 02/16/19 9381      PT LONG TERM GOAL #1   Title  increase shoulder flexion to 150 degrees    Status  Achieved      PT LONG TERM GOAL #2   Title  increase shoulder IR to 60 degrees    Status  On-going      PT LONG TERM GOAL #3   Title  decrease pain 50%    Status  Partially Met      PT LONG TERM GOAL #4   Title  understand posture and body mechanics    Status  Partially Met            Plan - 02/23/19 1018    Clinical Impression Statement  Pt has good ROM overall but is still lacking sine L shoulder internal rotation. Some pain reported with L shoulder passive internal rotation.  Postural cues required with seated rows to not allow shoulders to protrude forward. Pt responded well to Jt mobes with increase tissue elasticity evident greater passive IR.    Stability/Clinical Decision Making  Stable/Uncomplicated    Rehab Potential  Good    PT Frequency  2x / week    PT Duration  8 weeks    PT Treatment/Interventions  ADLs/Self Care Home Management;Cryotherapy;Electrical Stimulation;Iontophoresis 63m/ml Dexamethasone;Moist Heat;Ultrasound;Therapeutic activities;Therapeutic exercise;Patient/family education;Manual techniques    PT Next Visit Plan  L shoulder internal rotation flexability       Patient will benefit from skilled therapeutic intervention in order to improve the following deficits and impairments:  Pain, Improper body mechanics, Postural dysfunction, Decreased range of motion, Decreased strength, Impaired UE functional use  Visit Diagnosis: 1. Acute pain of left shoulder   2. Acute pain of right knee   3. Stiffness of left shoulder, not  elsewhere classified        Problem List There are no active problems to display for this patient.   RScot Jun PTA 02/23/2019, 10:28 AM  [place] [address]Phone: [phone]  Fax:  [phone] Name: Damon CALABRETTAMRN: [id number] of Birth: [birth year]

## 2019-02-26 DIAGNOSIS — Z87891 Personal history of nicotine dependence: Secondary | ICD-10-CM | POA: Diagnosis not present

## 2019-02-26 DIAGNOSIS — I2111 ST elevation (STEMI) myocardial infarction involving right coronary artery: Secondary | ICD-10-CM | POA: Diagnosis not present

## 2019-02-27 ENCOUNTER — Other Ambulatory Visit: Payer: Self-pay

## 2019-02-27 ENCOUNTER — Encounter: Payer: Self-pay | Admitting: Physical Therapy

## 2019-02-27 ENCOUNTER — Other Ambulatory Visit: Payer: Self-pay | Admitting: Geriatric Medicine

## 2019-02-27 ENCOUNTER — Ambulatory Visit: Payer: Medicare HMO | Admitting: Physical Therapy

## 2019-02-27 DIAGNOSIS — E042 Nontoxic multinodular goiter: Secondary | ICD-10-CM | POA: Diagnosis not present

## 2019-02-27 DIAGNOSIS — E7800 Pure hypercholesterolemia, unspecified: Secondary | ICD-10-CM | POA: Diagnosis not present

## 2019-02-27 DIAGNOSIS — M25561 Pain in right knee: Secondary | ICD-10-CM

## 2019-02-27 DIAGNOSIS — I2510 Atherosclerotic heart disease of native coronary artery without angina pectoris: Secondary | ICD-10-CM | POA: Diagnosis not present

## 2019-02-27 DIAGNOSIS — Z0000 Encounter for general adult medical examination without abnormal findings: Secondary | ICD-10-CM | POA: Diagnosis not present

## 2019-02-27 DIAGNOSIS — M25612 Stiffness of left shoulder, not elsewhere classified: Secondary | ICD-10-CM | POA: Diagnosis not present

## 2019-02-27 DIAGNOSIS — I10 Essential (primary) hypertension: Secondary | ICD-10-CM | POA: Diagnosis not present

## 2019-02-27 DIAGNOSIS — M25512 Pain in left shoulder: Secondary | ICD-10-CM

## 2019-02-27 DIAGNOSIS — Z1389 Encounter for screening for other disorder: Secondary | ICD-10-CM | POA: Diagnosis not present

## 2019-02-27 DIAGNOSIS — Z79899 Other long term (current) drug therapy: Secondary | ICD-10-CM | POA: Diagnosis not present

## 2019-02-27 DIAGNOSIS — R197 Diarrhea, unspecified: Secondary | ICD-10-CM | POA: Diagnosis not present

## 2019-02-27 NOTE — Therapy (Signed)
[place], [place], [address] Phone: [phone]   Fax:  [phone]  Physical Therapy Treatment  Patient Details  Name: Damon Sanders MRN: [id number] Date of Birth: Aug 10, 1946 Referring Provider (PT): Noemi Chapel   Encounter Date: 02/27/2019  PT End of Session - 02/27/19 0839    Visit Number  9    Date for PT Re-Evaluation  03/28/19    PT Start Time  0800    PT Stop Time  0843    PT Time Calculation (min)  43 min    Activity Tolerance  Patient tolerated treatment well    Behavior During Therapy  [facility] for tasks assessed/performed       Past Medical History:  Diagnosis Date  . Hyperlipidemia   . Hypertension     Past Surgical History:  Procedure Laterality Date  . THYROIDECTOMY, PARTIAL  1999    There were no vitals filed for this visit.  Subjective Assessment - 02/27/19 0758    Subjective  "Its there, still hurts periodically "    Currently in Pain?  No/denies    Pain Score  0-No pain         [facility] PT Assessment - 02/27/19 0001      AROM   Left Shoulder Flexion  180 Degrees    Left Shoulder ABduction  180 Degrees    Left Shoulder Internal Rotation  47 Degrees    Left Shoulder External Rotation  90 Degrees                   [facility] Adult PT Treatment/Exercise - 02/27/19 0001      Shoulder Exercises: Standing   Horizontal ABduction  Strengthening;Both;20 reps;Theraband    Theraband Level (Shoulder Horizontal ABduction)  Level 3 (Green)    External Rotation  Theraband;Left;15 reps;Strengthening   x2   Theraband Level (Shoulder External Rotation)  Level 3 (Green)    Internal Rotation  Theraband;15 reps;Left;Strengthening   x2   Theraband Level (Shoulder Internal Rotation)  Level 3 (Green)    Flexion  20 reps;Both;Theraband    Shoulder Flexion Weight (lbs)  5    ABduction  Theraband;20 reps;Both;Strengthening    Shoulder ABduction Weight (lbs)  4    Other  Standing Exercises  Tricep ext 25lb 2x10, Biceps curls 20lb 2x10      Shoulder Exercises: ROM/Strengthening   UBE (Upper Arm Bike)  L4.5  x3 min each     Other ROM/Strengthening Exercises  Rows and Lats 35lb 2x10     Other ROM/Strengthening Exercises  chest Press 25lb 2x10       Manual Therapy   Manual Therapy  Passive ROM;Soft tissue mobilization;Joint mobilization    Joint Mobilization  GH jt graded 2-3               PT Short Term Goals - 01/26/19 1035      PT SHORT TERM GOAL #1   Title  independent with initial HEP    Time  2    Period  Weeks    Status  New        PT Long Term Goals - 02/27/19 0809      PT LONG TERM GOAL #1   Title  increase shoulder flexion to 150 degrees    Status  Achieved      PT LONG TERM GOAL #2   Title  increase shoulder IR to 60 degrees    Status  On-going  PT LONG TERM GOAL #3   Title  decrease pain 50%    Status  Partially Met      PT LONG TERM GOAL #4   Title  understand posture and body mechanics    Status  Achieved            Plan - 02/27/19 0840    Clinical Impression Statement  Overall pt is doing well, he reports no functional limitations although he lacks some L shoulder internal rotation. He reports the occasional pain with some motions. He demos good strength with today's session. Postural cues needed with seated rows to keep his shoulders back.    Stability/Clinical Decision Making  Stable/Uncomplicated    Rehab Potential  Good    PT Frequency  2x / week    PT Duration  8 weeks    PT Treatment/Interventions  ADLs/Self Care Home Management;Cryotherapy;Electrical Stimulation;Iontophoresis '4mg'$ /ml Dexamethasone;Moist Heat;Ultrasound;Therapeutic activities;Therapeutic exercise;Patient/family education;Manual techniques    PT Next Visit Plan  L shoulder internal rotation flexibility, Pt goes to MD Thursday       Patient will benefit from skilled therapeutic intervention in order to improve the following deficits  and impairments:  Pain, Improper body mechanics, Postural dysfunction, Decreased range of motion, Decreased strength, Impaired UE functional use  Visit Diagnosis: 1. Acute pain of left shoulder   2. Acute pain of right knee   3. Stiffness of left shoulder, not elsewhere classified        Problem List There are no active problems to display for this patient.   Damon Sanders, PTA 02/27/2019, 8:42 AM  [place], [place], [address] Phone: [phone]   Fax:  [phone]  Name: Damon Sanders MRN: [id number] Date of Birth: Dec 20, 1945

## 2019-02-28 DIAGNOSIS — I2111 ST elevation (STEMI) myocardial infarction involving right coronary artery: Secondary | ICD-10-CM | POA: Diagnosis not present

## 2019-02-28 DIAGNOSIS — Z87891 Personal history of nicotine dependence: Secondary | ICD-10-CM | POA: Diagnosis not present

## 2019-03-01 DIAGNOSIS — I2111 ST elevation (STEMI) myocardial infarction involving right coronary artery: Secondary | ICD-10-CM | POA: Diagnosis not present

## 2019-03-01 DIAGNOSIS — M7582 Other shoulder lesions, left shoulder: Secondary | ICD-10-CM | POA: Diagnosis not present

## 2019-03-01 DIAGNOSIS — Z87891 Personal history of nicotine dependence: Secondary | ICD-10-CM | POA: Diagnosis not present

## 2019-03-02 ENCOUNTER — Other Ambulatory Visit: Payer: Self-pay

## 2019-03-02 ENCOUNTER — Ambulatory Visit: Payer: Medicare HMO | Admitting: Physical Therapy

## 2019-03-02 ENCOUNTER — Ambulatory Visit
Admission: RE | Admit: 2019-03-02 | Discharge: 2019-03-02 | Disposition: A | Discharge status: Home / Self Care | Payer: Medicare HMO | Source: Ambulatory Visit | Attending: Geriatric Medicine | Admitting: Geriatric Medicine

## 2019-03-02 ENCOUNTER — Encounter: Payer: Self-pay | Admitting: Physical Therapy

## 2019-03-02 DIAGNOSIS — M25512 Pain in left shoulder: Secondary | ICD-10-CM | POA: Diagnosis not present

## 2019-03-02 DIAGNOSIS — M25612 Stiffness of left shoulder, not elsewhere classified: Secondary | ICD-10-CM | POA: Diagnosis not present

## 2019-03-02 DIAGNOSIS — E042 Nontoxic multinodular goiter: Secondary | ICD-10-CM | POA: Diagnosis not present

## 2019-03-02 DIAGNOSIS — M25561 Pain in right knee: Secondary | ICD-10-CM

## 2019-03-02 NOTE — Therapy (Addendum)
[place], [place], [address] Phone: [phone]   Fax:  [phone] Progress Note Reporting Period 01/26/19 to 03/02/19 for the first 10 visits See note below for Objective Data and Assessment of Progress/Goals.      Physical Therapy Treatment  Patient Details  Name: Damon Sanders MRN: [id number] Date of Birth: 1946/04/04 Referring Provider (PT): Noemi Chapel   Encounter Date: 03/02/2019  PT End of Session - 03/02/19 0930    Visit Number  10    Date for PT Re-Evaluation  03/28/19    PT Start Time  0845    PT Stop Time  0930    PT Time Calculation (min)  45 min    Activity Tolerance  Patient tolerated treatment well    Behavior During Therapy  [facility] for tasks assessed/performed       Past Medical History:  Diagnosis Date  . Hyperlipidemia   . Hypertension     Past Surgical History:  Procedure Laterality Date  . THYROIDECTOMY, PARTIAL  1999    There were no vitals filed for this visit.  Subjective Assessment - 03/02/19 0848    Subjective  Went to MD yesterday reports 2 more weeks of therapy then HEP for a month    Currently in Pain?  Yes    Pain Score  2     Pain Location  Shoulder    Pain Orientation  Left                       [facility] Adult PT Treatment/Exercise - 03/02/19 0001      Shoulder Exercises: Standing   External Rotation  Theraband;Left;15 reps;Strengthening    Theraband Level (Shoulder External Rotation)  Level 3 (Green)    Internal Rotation  Theraband;15 reps;Left;Strengthening    Theraband Level (Shoulder Internal Rotation)  Level 3 (Green)    Flexion  20 reps;Both;Theraband    Shoulder Flexion Weight (lbs)  5    ABduction  Theraband;20 reps;Both;Strengthening    Shoulder ABduction Weight (lbs)  4    Other Standing Exercises  3 way for 2lb x 5 each     Other Standing Exercises  D2 flexion 3lb x10 each       Shoulder Exercises: ROM/Strengthening    UBE (Upper Arm Bike)  L7  x3 min each     Other ROM/Strengthening Exercises  Rows and Lats 35lb 2x15     Other ROM/Strengthening Exercises  chest Press 35lb 2x10       Manual Therapy   Manual Therapy  Passive ROM;Soft tissue mobilization;Joint mobilization    Passive ROM  L shoulder all directions, mostly IR                PT Short Term Goals - 01/26/19 1035      PT SHORT TERM GOAL #1   Title  independent with initial HEP    Time  2    Period  Weeks    Status  New        PT Long Term Goals - 02/27/19 0809      PT LONG TERM GOAL #1   Title  increase shoulder flexion to 150 degrees    Status  Achieved      PT LONG TERM GOAL #2   Title  increase shoulder IR to 60 degrees    Status  On-going      PT LONG TERM GOAL #3   Title  decrease pain 50%    Status  Partially Met      PT LONG TERM GOAL #4   Title  understand posture and body mechanics    Status  Achieved            Plan - 03/02/19 0931    Clinical Impression Statement  Pt just returned from MD. Pt reports 2 more weeks with PT then HEP for a month. If he still has pain will get an injection. Pt has full PROM with MT. Some L shoulder ER weakness noted. Some difficulty keeping LUR abducted to 90 deg and preforming external rotation.    Personal Factors and Comorbidities  Behavior Pattern    Stability/Clinical Decision Making  Stable/Uncomplicated    Rehab Potential  Good    PT Frequency  2x / week    PT Duration  8 weeks    PT Treatment/Interventions  ADLs/Self Care Home Management;Cryotherapy;Electrical Stimulation;Iontophoresis '4mg'$ /ml Dexamethasone;Moist Heat;Ultrasound;Therapeutic activities;Therapeutic exercise;Patient/family education;Manual techniques    PT Next Visit Plan  L shoulder internal rotation flexability       Patient will benefit from skilled therapeutic intervention in order to improve the following deficits and impairments:  Pain, Improper body mechanics, Postural dysfunction,  Decreased range of motion, Decreased strength, Impaired UE functional use  Visit Diagnosis: 1. Acute pain of left shoulder   2. Acute pain of right knee   3. Stiffness of left shoulder, not elsewhere classified        Problem List There are no active problems to display for this patient.   Scot Jun 03/02/2019, 9:39 AM  [place] [address] [place], [place], [address] Phone: [phone]   Fax:  [phone]  Name: Damon Sanders MRN: [id number] Date of Birth: 25-Feb-1946

## 2019-03-02 NOTE — Patient Instructions (Signed)
Access Code: [email or web address]  URL: [email or web address]  Date: 03/02/2019  Prepared by: Cheri Fowler   Exercises  Standing Row with Resistance - 10 reps - 3 sets - 1x daily - 7x weekly  Standing Elbow Extension with Anchored Resistance - 10 reps - 3 sets - 1x daily - 7x weekly  Shoulder External Rotation and Scapular Retraction with Resistance - 10 reps - 3 sets - 1x daily - 7x weekly  Shoulder Internal Rotation with Resistance - 10 reps - 3 sets - 1x daily - 7x weekly

## 2019-03-05 DIAGNOSIS — I2111 ST elevation (STEMI) myocardial infarction involving right coronary artery: Secondary | ICD-10-CM | POA: Diagnosis not present

## 2019-03-05 DIAGNOSIS — Z87891 Personal history of nicotine dependence: Secondary | ICD-10-CM | POA: Diagnosis not present

## 2019-03-06 ENCOUNTER — Ambulatory Visit: Payer: Medicare HMO | Admitting: Physical Therapy

## 2019-03-06 ENCOUNTER — Encounter: Payer: Self-pay | Admitting: Physical Therapy

## 2019-03-06 ENCOUNTER — Other Ambulatory Visit: Payer: Self-pay

## 2019-03-06 DIAGNOSIS — M25512 Pain in left shoulder: Secondary | ICD-10-CM | POA: Diagnosis not present

## 2019-03-06 DIAGNOSIS — M25561 Pain in right knee: Secondary | ICD-10-CM

## 2019-03-06 DIAGNOSIS — M25612 Stiffness of left shoulder, not elsewhere classified: Secondary | ICD-10-CM | POA: Diagnosis not present

## 2019-03-06 NOTE — Therapy (Signed)
[place], [place], [address] Phone: [phone]   Fax:  [phone]  Physical Therapy Treatment  Patient Details  Name: Damon Sanders MRN: [id number] Date of Birth: [birth year] Referring Provider (PT): Noemi Chapel   Encounter Date: 03/06/2019  PT End of Session - 03/06/19 1012    Visit Number  11    Date for PT Re-Evaluation  03/28/19    PT Start Time  0925    PT Stop Time  1008    PT Time Calculation (min)  43 min    Activity Tolerance  Patient tolerated treatment well    Behavior During Therapy  [facility] for tasks assessed/performed       Past Medical History:  Diagnosis Date  . Hyperlipidemia   . Hypertension     Past Surgical History:  Procedure Laterality Date  . THYROIDECTOMY, PARTIAL  1999    There were no vitals filed for this visit.  Subjective Assessment - 03/06/19 0926    Subjective  "Its ok still hurt periodically"    Currently in Pain?  No/denies                       [facility] Adult PT Treatment/Exercise - 03/06/19 0001      Shoulder Exercises: Standing   Horizontal ABduction  Strengthening;Both;20 reps;Theraband    Theraband Level (Shoulder Horizontal ABduction)  Level 3 (Green)    External Rotation  Strengthening;Weights;20 reps;Left    External Rotation Weight (lbs)  5    Internal Rotation  Strengthening;Left;Weights;20 reps    Internal Rotation Weight (lbs)  5    ABduction  Theraband;20 reps;Both;Strengthening    Extension  20 reps;Both;Strengthening;Weights    Extension Weight (lbs)  15    Other Standing Exercises  OHP 4lb 2x10     Other Standing Exercises  D2 flexion 3lb x10 each       Shoulder Exercises: ROM/Strengthening   UBE (Upper Arm Bike)  L7  x3 min each     Other ROM/Strengthening Exercises  Rows and Lats 45lb 2x10     Other ROM/Strengthening Exercises  chest Press 35lb 2x10       Manual Therapy   Manual Therapy  Passive ROM;Soft  tissue mobilization;Joint mobilization    Joint Mobilization  GH jt graded 2-3    Passive ROM  L shoulder all directions, mostly IR                PT Short Term Goals - 01/26/19 1035      PT SHORT TERM GOAL #1   Title  independent with initial HEP    Time  2    Period  Weeks    Status  New        PT Long Term Goals - 02/27/19 0809      PT LONG TERM GOAL #1   Title  increase shoulder flexion to 150 degrees    Status  Achieved      PT LONG TERM GOAL #2   Title  increase shoulder IR to 60 degrees    Status  On-going      PT LONG TERM GOAL #3   Title  decrease pain 50%    Status  Partially Met      PT LONG TERM GOAL #4   Title  understand posture and body mechanics    Status  Achieved            Plan -  03/06/19 1013    Clinical Impression Statement  Pt continues to have full PROM. Initial restriction with internal rotation but this is helped with Jt mobes. He reports no functional limitation at home other than pain. Active IR remains a issue in regards to ROM. Some difficulty today with ER/IR on pulley machine due to weakness.    Stability/Clinical Decision Making  Stable/Uncomplicated    Rehab Potential  Good    PT Frequency  2x / week    PT Treatment/Interventions  ADLs/Self Care Home Management;Cryotherapy;Electrical Stimulation;Iontophoresis 37m/ml Dexamethasone;Moist Heat;Ultrasound;Therapeutic activities;Therapeutic exercise;Patient/family education;Manual techniques    PT Next Visit Plan  L shoulder internal rotation flexability       Patient will benefit from skilled therapeutic intervention in order to improve the following deficits and impairments:  Pain, Improper body mechanics, Postural dysfunction, Decreased range of motion, Decreased strength, Impaired UE functional use  Visit Diagnosis: 1. Acute pain of left shoulder   2. Acute pain of right knee   3. Stiffness of left shoulder, not elsewhere classified        Problem List There are  no active problems to display for this patient.   RScot Jun PTA 03/06/2019, 10:15 AM  [place] [address]Phone: [phone]  Fax:  [phone] Name: Damon CHAMPINEMRN: [id number] of Birth: [birth year]

## 2019-03-07 DIAGNOSIS — Z87891 Personal history of nicotine dependence: Secondary | ICD-10-CM | POA: Diagnosis not present

## 2019-03-07 DIAGNOSIS — I2111 ST elevation (STEMI) myocardial infarction involving right coronary artery: Secondary | ICD-10-CM | POA: Diagnosis not present

## 2019-03-08 DIAGNOSIS — Z87891 Personal history of nicotine dependence: Secondary | ICD-10-CM | POA: Diagnosis not present

## 2019-03-08 DIAGNOSIS — I2111 ST elevation (STEMI) myocardial infarction involving right coronary artery: Secondary | ICD-10-CM | POA: Diagnosis not present

## 2019-03-09 ENCOUNTER — Ambulatory Visit: Payer: Medicare HMO | Admitting: Physical Therapy

## 2019-03-09 ENCOUNTER — Other Ambulatory Visit: Payer: Self-pay

## 2019-03-09 ENCOUNTER — Encounter: Payer: Self-pay | Admitting: Physical Therapy

## 2019-03-09 DIAGNOSIS — M25561 Pain in right knee: Secondary | ICD-10-CM

## 2019-03-09 DIAGNOSIS — M25612 Stiffness of left shoulder, not elsewhere classified: Secondary | ICD-10-CM | POA: Diagnosis not present

## 2019-03-09 DIAGNOSIS — M25512 Pain in left shoulder: Secondary | ICD-10-CM | POA: Diagnosis not present

## 2019-03-09 NOTE — Therapy (Signed)
[place], [place], [address] Phone: [phone]   Fax:  [phone]  Physical Therapy Treatment  Patient Details  Name: Damon Sanders MRN: [id number] Date of Birth: 1945-09-24 Referring Provider (PT): Noemi Chapel   Encounter Date: 03/09/2019  PT End of Session - 03/09/19 0930    Visit Number  12    Date for PT Re-Evaluation  03/28/19    PT Start Time  0845    PT Stop Time  0930    PT Time Calculation (min)  45 min    Activity Tolerance  Patient tolerated treatment well    Behavior During Therapy  [facility] for tasks assessed/performed       Past Medical History:  Diagnosis Date  . Hyperlipidemia   . Hypertension     Past Surgical History:  Procedure Laterality Date  . THYROIDECTOMY, PARTIAL  1999    There were no vitals filed for this visit.  Subjective Assessment - 03/09/19 0846    Subjective  "Same old, hurts a little bit when you wake up in the morning"    Patient Stated Goals  have less pain and better motions    Currently in Pain?  No/denies         [facility] PT Assessment - 03/09/19 0001      AROM   Left Shoulder Flexion  180 Degrees    Left Shoulder ABduction  180 Degrees    Left Shoulder Internal Rotation  54 Degrees    Left Shoulder External Rotation  90 Degrees                   [facility] Adult PT Treatment/Exercise - 03/09/19 0001      Shoulder Exercises: Standing   External Rotation  Strengthening;Weights;Left;15 reps   x2   External Rotation Weight (lbs)  5    Internal Rotation  Strengthening;Left;Weights;15 reps   x2   Internal Rotation Weight (lbs)  5    ABduction  Theraband;20 reps;Both;Strengthening    Shoulder ABduction Weight (lbs)  4    Extension  20 reps;Both;Strengthening;Weights    Extension Weight (lbs)  15    Other Standing Exercises  wall push ups 2x10       Shoulder Exercises: ROM/Strengthening   UBE (Upper Arm Bike)  L7  x3 min each     Other ROM/Strengthening Exercises  Rows and Lats 45lb 2x15    Other ROM/Strengthening Exercises  chest Press 35lb 2x15       Manual Therapy   Manual Therapy  Passive ROM;Soft tissue mobilization;Joint mobilization    Joint Mobilization  GH jt graded 2-3    Passive ROM  L shoulder all directions, mostly IR                PT Short Term Goals - 01/26/19 1035      PT SHORT TERM GOAL #1   Title  independent with initial HEP    Time  2    Period  Weeks    Status  New        PT Long Term Goals - 02/27/19 0809      PT LONG TERM GOAL #1   Title  increase shoulder flexion to 150 degrees    Status  Achieved      PT LONG TERM GOAL #2   Title  increase shoulder IR to 60 degrees    Status  On-going      PT LONG TERM  GOAL #3   Title  decrease pain 50%    Status  Partially Met      PT LONG TERM GOAL #4   Title  understand posture and body mechanics    Status  Achieved            Plan - 03/09/19 0932    Clinical Impression Statement  Pt with a mild increase in L shoulder Internal rotation but overall remains limited. increase reps tolerated with most seated machine level interventions. He continues to respond good with Jt mobes evident by increase tissues elasticity and improved IR ROM without compensation    Stability/Clinical Decision Making  Stable/Uncomplicated    Rehab Potential  Good    PT Frequency  2x / week    PT Duration  8 weeks    PT Treatment/Interventions  ADLs/Self Care Home Management;Cryotherapy;Electrical Stimulation;Iontophoresis '4mg'$ /ml Dexamethasone;Moist Heat;Ultrasound;Therapeutic activities;Therapeutic exercise;Patient/family education;Manual techniques    PT Next Visit Plan  L shoulder internal rotation flexability       Patient will benefit from skilled therapeutic intervention in order to improve the following deficits and impairments:  Pain, Improper body mechanics, Postural dysfunction, Decreased range of motion, Decreased strength, Impaired  UE functional use  Visit Diagnosis: 1. Acute pain of left shoulder   2. Acute pain of right knee   3. Stiffness of left shoulder, not elsewhere classified        Problem List There are no active problems to display for this patient.   Scot Jun, PTA 03/09/2019, 9:40 AM  [place], [place], [address] Phone: [phone]   Fax:  [phone]  Name: Damon Sanders MRN: [id number] Date of Birth: [birth year]

## 2019-03-12 DIAGNOSIS — I2111 ST elevation (STEMI) myocardial infarction involving right coronary artery: Secondary | ICD-10-CM | POA: Diagnosis not present

## 2019-03-12 DIAGNOSIS — Z87891 Personal history of nicotine dependence: Secondary | ICD-10-CM | POA: Diagnosis not present

## 2019-03-13 ENCOUNTER — Other Ambulatory Visit: Payer: Self-pay

## 2019-03-13 ENCOUNTER — Ambulatory Visit: Payer: Medicare HMO | Admitting: Physical Therapy

## 2019-03-13 ENCOUNTER — Encounter: Payer: Self-pay | Admitting: Physical Therapy

## 2019-03-13 DIAGNOSIS — M25512 Pain in left shoulder: Secondary | ICD-10-CM

## 2019-03-13 DIAGNOSIS — M25612 Stiffness of left shoulder, not elsewhere classified: Secondary | ICD-10-CM

## 2019-03-13 DIAGNOSIS — M25561 Pain in right knee: Secondary | ICD-10-CM | POA: Diagnosis not present

## 2019-03-13 NOTE — Therapy (Signed)
[place], [place], [address] Phone: [phone]   Fax:  [phone]  Physical Therapy Treatment  Patient Details  Name: Damon Sanders MRN: [id number] Date of Birth: 1945/11/19 Referring Provider (PT): Noemi Chapel   Encounter Date: 03/13/2019  PT End of Session - 03/13/19 1427    Visit Number  13    Date for PT Re-Evaluation  03/28/19    PT Start Time  1345    PT Stop Time  1426    PT Time Calculation (min)  41 min    Activity Tolerance  Patient tolerated treatment well    Behavior During Therapy  [facility] for tasks assessed/performed       Past Medical History:  Diagnosis Date  . Hyperlipidemia   . Hypertension     Past Surgical History:  Procedure Laterality Date  . THYROIDECTOMY, PARTIAL  1999    There were no vitals filed for this visit.  Subjective Assessment - 03/13/19 1349    Subjective  "Pretty good I guess"    Currently in Pain?  Yes    Pain Score  1     Pain Location  Shoulder    Pain Orientation  Left                       [facility] Adult PT Treatment/Exercise - 03/13/19 0001      Shoulder Exercises: Standing   External Rotation  Strengthening;Weights;Left;15 reps;Other (comment)   x2   Theraband Level (Shoulder External Rotation)  Level 4 (Blue)    Internal Rotation  Strengthening;Left;Weights;15 reps   x2   Theraband Level (Shoulder Internal Rotation)  Level 4 (Blue)    Flexion  20 reps;Both;Theraband    Shoulder Flexion Weight (lbs)  5    ABduction  Theraband;20 reps;Both;Strengthening    Shoulder ABduction Weight (lbs)  4    Extension  20 reps;Both;Strengthening;Weights    Extension Weight (lbs)  15    Other Standing Exercises  Back agains wal flexionwith cane 5lb cuff full ROM 2x10    Other Standing Exercises  D2 flexion 3lb x10 each       Shoulder Exercises: ROM/Strengthening   UBE (Upper Arm Bike)  L7  x3 min each     Other ROM/Strengthening  Exercises  Rows and Lats 55lb 2x10    Other ROM/Strengthening Exercises  chest Press 35lb 2x15                PT Short Term Goals - 01/26/19 1035      PT SHORT TERM GOAL #1   Title  independent with initial HEP    Time  2    Period  Weeks    Status  New        PT Long Term Goals - 03/13/19 1352      PT LONG TERM GOAL #2   Title  increase shoulder IR to 60 degrees    Status  On-going      PT LONG TERM GOAL #3   Title  decrease pain 50%    Status  Partially Met      PT LONG TERM GOAL #4   Title  understand posture and body mechanics    Status  Achieved            Plan - 03/13/19 1427    Clinical Impression Statement  Pt continues to do well over all. he report no functional limitations at home.  He does report some occasional pain, but mostly in the morning. Increase resistance tolerated with seated rows and lats. Full ROM with flexion with cane under 5lb resistance.    Stability/Clinical Decision Making  Stable/Uncomplicated    Rehab Potential  Good    PT Frequency  2x / week    PT Duration  8 weeks    PT Treatment/Interventions  ADLs/Self Care Home Management;Cryotherapy;Electrical Stimulation;Iontophoresis '4mg'$ /ml Dexamethasone;Moist Heat;Ultrasound;Therapeutic activities;Therapeutic exercise;Patient/family education;Manual techniques    PT Next Visit Plan  L shoulder internal rotation flexability, D/c next visit       Patient will benefit from skilled therapeutic intervention in order to improve the following deficits and impairments:  Pain, Improper body mechanics, Postural dysfunction, Decreased range of motion, Decreased strength, Impaired UE functional use  Visit Diagnosis: 1. Acute pain of right knee   2. Stiffness of left shoulder, not elsewhere classified   3. Acute pain of left shoulder        Problem List There are no active problems to display for this patient.   Scot Jun, PTA 03/13/2019, 2:32 PM  [place], [place], [address] Phone: [phone]   Fax:  [phone]  Name: Damon Sanders MRN: [id number] Date of Birth: May 12, 1946

## 2019-03-14 DIAGNOSIS — Z87891 Personal history of nicotine dependence: Secondary | ICD-10-CM | POA: Diagnosis not present

## 2019-03-14 DIAGNOSIS — I2111 ST elevation (STEMI) myocardial infarction involving right coronary artery: Secondary | ICD-10-CM | POA: Diagnosis not present

## 2019-03-15 DIAGNOSIS — Z87891 Personal history of nicotine dependence: Secondary | ICD-10-CM | POA: Diagnosis not present

## 2019-03-15 DIAGNOSIS — I2111 ST elevation (STEMI) myocardial infarction involving right coronary artery: Secondary | ICD-10-CM | POA: Diagnosis not present

## 2019-03-16 ENCOUNTER — Ambulatory Visit: Payer: Medicare HMO | Admitting: Physical Therapy

## 2019-03-16 ENCOUNTER — Other Ambulatory Visit: Payer: Self-pay

## 2019-03-16 ENCOUNTER — Encounter: Payer: Self-pay | Admitting: Physical Therapy

## 2019-03-16 DIAGNOSIS — M25561 Pain in right knee: Secondary | ICD-10-CM

## 2019-03-16 DIAGNOSIS — M25612 Stiffness of left shoulder, not elsewhere classified: Secondary | ICD-10-CM | POA: Diagnosis not present

## 2019-03-16 DIAGNOSIS — M25512 Pain in left shoulder: Secondary | ICD-10-CM

## 2019-03-16 NOTE — Therapy (Signed)
[place], [place], [address] Phone: [phone]   Fax:  [phone]  Physical Therapy Treatment  Patient Details  Name: Damon Sanders MRN: [id number] Date of Birth: [birth year] Referring Provider (PT): Noemi Chapel   Encounter Date: 03/16/2019  PT End of Session - 03/16/19 0921    Visit Number  14    Date for PT Re-Evaluation  03/28/19    PT Start Time  0843    PT Stop Time  0923    PT Time Calculation (min)  40 min    Activity Tolerance  Patient tolerated treatment well       Past Medical History:  Diagnosis Date  . Hyperlipidemia   . Hypertension     Past Surgical History:  Procedure Laterality Date  . THYROIDECTOMY, PARTIAL  1999    There were no vitals filed for this visit.  Subjective Assessment - 03/16/19 0848    Subjective  "It is about the same , hurts every now and then"    Currently in Pain?  No/denies                       [facility] Adult PT Treatment/Exercise - 03/16/19 0001      Shoulder Exercises: Standing   External Rotation  Strengthening;Weights;Left;Other (comment);20 reps    External Rotation Weight (lbs)  10, 5    Internal Rotation  Strengthening;Left;Weights;20 reps    Internal Rotation Weight (lbs)  10    Flexion  20 reps;Both;Theraband    Shoulder Flexion Weight (lbs)  5    Extension  20 reps;Both;Strengthening;Weights    Extension Weight (lbs)  15    Other Standing Exercises  Back against wal flexion with cane 5lb cuff full ROM 2x10      Shoulder Exercises: ROM/Strengthening   UBE (Upper Arm Bike)  L7  x3 min each     Other ROM/Strengthening Exercises  Rows and Lats 45lb 3x10    Other ROM/Strengthening Exercises  chest Press 35lb 2x15                PT Short Term Goals - 01/26/19 1035      PT SHORT TERM GOAL #1   Title  independent with initial HEP    Time  2    Period  Weeks    Status  New        PT Long Term Goals -  03/16/19 2751      PT LONG TERM GOAL #1   Title  increase shoulder flexion to 150 degrees    Status  Achieved      PT LONG TERM GOAL #2   Title  increase shoulder IR to 60 degrees    Status  On-going      PT LONG TERM GOAL #3   Title  decrease pain 50%    Status  Achieved      PT LONG TERM GOAL #4   Title  understand posture and body mechanics    Status  Achieved            Plan - 03/16/19 0922    Clinical Impression Statement  Pt reports no functional limitations at home, despite limited being limited with internal rotation. He appears to have reached a therapeutic plateau. No issues with today's interventions.    Stability/Clinical Decision Making  Stable/Uncomplicated    Rehab Potential  Good    PT Frequency  2x / week  PT Treatment/Interventions  ADLs/Self Care Home Management;Cryotherapy;Electrical Stimulation;Iontophoresis 64m/ml Dexamethasone;Moist Heat;Ultrasound;Therapeutic activities;Therapeutic exercise;Patient/family education;Manual techniques    PT Next Visit Plan  D/C PT       Patient will benefit from skilled therapeutic intervention in order to improve the following deficits and impairments:  Pain, Improper body mechanics, Postural dysfunction, Decreased range of motion, Decreased strength, Impaired UE functional use  Visit Diagnosis: 1. Stiffness of left shoulder, not elsewhere classified   2. Acute pain of right knee   3. Acute pain of left shoulder        Problem List There are no active problems to display for this patient.  PHYSICAL THERAPY DISCHARGE SUMMARY  Visits from Start of Care: 14  Plan: Patient agrees to discharge.  Patient goals were partially met. Patient is being discharged due to being pleased with the current functional level.  ?????       RScot Jun7/31/2020, 9:25 AM  [address] [address]: [phone]   Fax:  [phone] Name: Damon SILASMRN: [id number] of Birth: [birth year]

## 2019-03-19 DIAGNOSIS — I2111 ST elevation (STEMI) myocardial infarction involving right coronary artery: Secondary | ICD-10-CM | POA: Diagnosis not present

## 2019-03-19 DIAGNOSIS — Z5189 Encounter for other specified aftercare: Secondary | ICD-10-CM | POA: Diagnosis not present

## 2019-03-19 DIAGNOSIS — Z87891 Personal history of nicotine dependence: Secondary | ICD-10-CM | POA: Diagnosis not present

## 2019-03-21 ENCOUNTER — Telehealth: Payer: Self-pay

## 2019-03-21 DIAGNOSIS — I2111 ST elevation (STEMI) myocardial infarction involving right coronary artery: Secondary | ICD-10-CM | POA: Diagnosis not present

## 2019-03-21 DIAGNOSIS — Z87891 Personal history of nicotine dependence: Secondary | ICD-10-CM | POA: Diagnosis not present

## 2019-03-21 DIAGNOSIS — Z5189 Encounter for other specified aftercare: Secondary | ICD-10-CM | POA: Diagnosis not present

## 2019-03-21 NOTE — Telephone Encounter (Signed)
Not needed

## 2019-03-22 DIAGNOSIS — Z5189 Encounter for other specified aftercare: Secondary | ICD-10-CM | POA: Diagnosis not present

## 2019-03-22 DIAGNOSIS — Z87891 Personal history of nicotine dependence: Secondary | ICD-10-CM | POA: Diagnosis not present

## 2019-03-22 DIAGNOSIS — I2111 ST elevation (STEMI) myocardial infarction involving right coronary artery: Secondary | ICD-10-CM | POA: Diagnosis not present

## 2019-03-26 DIAGNOSIS — Z5189 Encounter for other specified aftercare: Secondary | ICD-10-CM | POA: Diagnosis not present

## 2019-03-26 DIAGNOSIS — I2111 ST elevation (STEMI) myocardial infarction involving right coronary artery: Secondary | ICD-10-CM | POA: Diagnosis not present

## 2019-03-26 DIAGNOSIS — Z87891 Personal history of nicotine dependence: Secondary | ICD-10-CM | POA: Diagnosis not present

## 2019-03-28 DIAGNOSIS — I2111 ST elevation (STEMI) myocardial infarction involving right coronary artery: Secondary | ICD-10-CM | POA: Diagnosis not present

## 2019-03-28 DIAGNOSIS — Z5189 Encounter for other specified aftercare: Secondary | ICD-10-CM | POA: Diagnosis not present

## 2019-03-28 DIAGNOSIS — Z87891 Personal history of nicotine dependence: Secondary | ICD-10-CM | POA: Diagnosis not present

## 2019-03-29 DIAGNOSIS — I2111 ST elevation (STEMI) myocardial infarction involving right coronary artery: Secondary | ICD-10-CM | POA: Diagnosis not present

## 2019-03-29 DIAGNOSIS — Z5189 Encounter for other specified aftercare: Secondary | ICD-10-CM | POA: Diagnosis not present

## 2019-03-29 DIAGNOSIS — Z87891 Personal history of nicotine dependence: Secondary | ICD-10-CM | POA: Diagnosis not present

## 2019-04-02 DIAGNOSIS — I2111 ST elevation (STEMI) myocardial infarction involving right coronary artery: Secondary | ICD-10-CM | POA: Diagnosis not present

## 2019-04-02 DIAGNOSIS — Z87891 Personal history of nicotine dependence: Secondary | ICD-10-CM | POA: Diagnosis not present

## 2019-04-02 DIAGNOSIS — Z5189 Encounter for other specified aftercare: Secondary | ICD-10-CM | POA: Diagnosis not present

## 2019-04-04 DIAGNOSIS — Z87891 Personal history of nicotine dependence: Secondary | ICD-10-CM | POA: Diagnosis not present

## 2019-04-04 DIAGNOSIS — Z5189 Encounter for other specified aftercare: Secondary | ICD-10-CM | POA: Diagnosis not present

## 2019-04-04 DIAGNOSIS — I2111 ST elevation (STEMI) myocardial infarction involving right coronary artery: Secondary | ICD-10-CM | POA: Diagnosis not present

## 2019-04-05 DIAGNOSIS — Z87891 Personal history of nicotine dependence: Secondary | ICD-10-CM | POA: Diagnosis not present

## 2019-04-05 DIAGNOSIS — Z5189 Encounter for other specified aftercare: Secondary | ICD-10-CM | POA: Diagnosis not present

## 2019-04-05 DIAGNOSIS — M549 Dorsalgia, unspecified: Secondary | ICD-10-CM | POA: Diagnosis not present

## 2019-04-05 DIAGNOSIS — I2111 ST elevation (STEMI) myocardial infarction involving right coronary artery: Secondary | ICD-10-CM | POA: Diagnosis not present

## 2019-04-09 DIAGNOSIS — I2111 ST elevation (STEMI) myocardial infarction involving right coronary artery: Secondary | ICD-10-CM | POA: Diagnosis not present

## 2019-04-09 DIAGNOSIS — Z87891 Personal history of nicotine dependence: Secondary | ICD-10-CM | POA: Diagnosis not present

## 2019-04-09 DIAGNOSIS — Z5189 Encounter for other specified aftercare: Secondary | ICD-10-CM | POA: Diagnosis not present

## 2019-04-11 DIAGNOSIS — I2111 ST elevation (STEMI) myocardial infarction involving right coronary artery: Secondary | ICD-10-CM | POA: Diagnosis not present

## 2019-04-11 DIAGNOSIS — Z5189 Encounter for other specified aftercare: Secondary | ICD-10-CM | POA: Diagnosis not present

## 2019-04-11 DIAGNOSIS — Z87891 Personal history of nicotine dependence: Secondary | ICD-10-CM | POA: Diagnosis not present

## 2019-04-12 DIAGNOSIS — I2111 ST elevation (STEMI) myocardial infarction involving right coronary artery: Secondary | ICD-10-CM | POA: Diagnosis not present

## 2019-04-12 DIAGNOSIS — Z5189 Encounter for other specified aftercare: Secondary | ICD-10-CM | POA: Diagnosis not present

## 2019-04-12 DIAGNOSIS — Z87891 Personal history of nicotine dependence: Secondary | ICD-10-CM | POA: Diagnosis not present

## 2019-04-16 DIAGNOSIS — Z5189 Encounter for other specified aftercare: Secondary | ICD-10-CM | POA: Diagnosis not present

## 2019-04-16 DIAGNOSIS — I2111 ST elevation (STEMI) myocardial infarction involving right coronary artery: Secondary | ICD-10-CM | POA: Diagnosis not present

## 2019-04-16 DIAGNOSIS — Z87891 Personal history of nicotine dependence: Secondary | ICD-10-CM | POA: Diagnosis not present

## 2019-04-18 ENCOUNTER — Telehealth: Payer: Self-pay

## 2019-04-18 DIAGNOSIS — I2111 ST elevation (STEMI) myocardial infarction involving right coronary artery: Secondary | ICD-10-CM | POA: Diagnosis not present

## 2019-04-18 DIAGNOSIS — Z87891 Personal history of nicotine dependence: Secondary | ICD-10-CM | POA: Diagnosis not present

## 2019-04-18 NOTE — Telephone Encounter (Signed)
This patient has not followed in our office.  His cardiologist is Dr. Marijo File in [facility].  Please notify the requesting office to contact the patient's cardiologist.

## 2019-04-18 NOTE — Telephone Encounter (Signed)
I will route this to Dr. Christ Kick so that he may have clearance information during appt 04/24/19

## 2019-04-18 NOTE — Telephone Encounter (Signed)
   [facility] Pre-operative Risk Assessment    Request for surgical clearance:  1. What type of surgery is being performed?  SMALL PERIODONTAL SURGERY-2 TEETH   2. When is this surgery scheduled?  TBD   3. What type of clearance is required (medical clearance vs. Pharmacy clearance to hold med vs. Both)? MEDICAL  4. Are there any medications that need to be held prior to surgery and how long? NO   5. Practice name and name of physician performing surgery? [facility]    6. What is your office phone number [phone]    7.   What is your office fax number [phone]  8.   Anesthesia type (None, local, MAC, general) ?  NON-EPI LOCAL ANESTH-2% XYLOCAINE W/NEOCORBEFRIN (?)  PLEASE INCLUDE STENT PLACEMENT DATE.   Waylan Rocher 04/18/2019, 2:01 PM  _________________________________________________________________   (provider comments below)

## 2019-04-19 DIAGNOSIS — I2111 ST elevation (STEMI) myocardial infarction involving right coronary artery: Secondary | ICD-10-CM | POA: Diagnosis not present

## 2019-04-19 DIAGNOSIS — Z87891 Personal history of nicotine dependence: Secondary | ICD-10-CM | POA: Diagnosis not present

## 2019-04-22 NOTE — Progress Notes (Signed)
Cardiology Office Note   Date:  04/24/2019   ID:  Damon Sanders, DOB 12-Jun-1946, MRN [id number]  PCP:  Damon Manes, MD    No chief complaint on file.  CAD  Wt Readings from Last 3 Encounters:  04/24/19 169 lb 9.6 oz (76.9 kg)  06/26/12 174 lb (78.9 kg)  06/12/12 174 lb (78.9 kg)       History of Present Illness: Damon Sanders is a 73 y.o. male who is being seen today for the evaluation of preoperative clearance at the request of [place], Christiane Ha, MD.    He had an inferior STEMI treated at [facility] on 01/11/2019.  He received one stent to the mid RCA, 3.0 x 22 mm Onyx.  He has done well since then.  He is in cardiac rehab.  He has been trying to eat healthy.  He had syncope a week before the MI.  A week later, he felt "funny" and was diaphoretic.  He had teeth pain.  He has a periodontal procedure that needs to be scheduled- Dr. Essie Sanders.     He has normal BP readings at [facility].  He had the readings with him on his phone.    Denies : Chest pain. Dizziness. Leg edema. Nitroglycerin use. Orthopnea. Palpitations. Paroxysmal nocturnal dyspnea. Shortness of breath. Syncope.   He does well at rehab.  Works as an Clinical biochemist.   Past Medical History:  Diagnosis Date  . Hyperlipidemia   . Hypertension     Past Surgical History:  Procedure Laterality Date  . THYROIDECTOMY, PARTIAL  1999     Current Outpatient Medications  Medication Sig Dispense Refill  . aspirin 81 MG tablet Take 81 mg by mouth daily.    Marland Kitchen atorvastatin (LIPITOR) 80 MG tablet Take by mouth.    . carvedilol (COREG) 3.125 MG tablet     . clopidogrel (PLAVIX) 75 MG tablet Take by mouth.    . fish oil-omega-3 fatty acids 1000 MG capsule Take 2 g by mouth daily.    Marland Kitchen losartan (COZAAR) 50 MG tablet TAKE 1 TABLET(50 MG) BY MOUTH DAILY     No current facility-administered medications for this visit.     Allergies:   Caine-1 [lidocaine], Penicillins, Procaine, and Lisinopril    Social  History:  The patient  reports that he has quit smoking. His smoking use included cigarettes. He quit after 10.00 years of use. He has never used smokeless tobacco. He reports current alcohol use of about 8.0 standard drinks of alcohol per week. He reports that he does not use drugs.   Family History:  The patient's family history includes Colon cancer in his father. Grandmother with angina.   ROS:  Please see the history of present illness.   Otherwise, review of systems are positive for easy bruising, occasional joint pain.   All other systems are reviewed and negative.    PHYSICAL EXAM: VS:  BP (!) 160/82   Pulse (!) 58   Ht 5\' 7"  (1.702 m)   Wt 169 lb 9.6 oz (76.9 kg)   SpO2 98%   BMI 26.56 kg/m  , BMI Body mass index is 26.56 kg/m. GEN: Well nourished, well developed, in no acute distress  HEENT: normal  Neck: no JVD, carotid bruits, or masses Cardiac: RRR; no murmurs, rubs, or gallops,no edema  Respiratory:  clear to auscultation bilaterally, normal work of breathing GI: soft, nontender, nondistended, + BS MS: no deformity or atrophy  Skin: warm and dry,  no rash Neuro:  Strength and sensation are intact Psych: euthymic mood, full affect   EKG:   The ekg ordered today demonstrates NSR, RBBB, LAFB- bifascicular block   Recent Labs: No results found for requested labs within last 8760 hours.   Lipid Panel No results found for: CHOL, TRIG, HDL, CHOLHDL, VLDL, LDLCALC, LDLDIRECT   Other studies Reviewed: Additional studies/ records that were reviewed today with results demonstrating: cath results reviewed in Care everywhere.   ASSESSMENT AND PLAN:  1. CAD: s/p RCA stent.  No angina.  No problems with exertion. LDL 48 in 02/2019. 2. 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  3. Preoperative cardiovascular exam: No further testing needed before gum surgery.  Will have to sort out if he can stay on aspirin and Plavix or if this has to be stopped.  Since he is > 3 months post stent, it would be ok  to temporarily stop Plavix for 5 days prior to procedure if the surgery is urgent.  If the procedure is not urgent and could wait, would consider waiting until December, which would be 6 months post MI.   4. Request cath report.  Minimize NSAID use.  OK for Tylenol.    Current medicines are reviewed at length with the patient today.  The patient concerns regarding his medicines were addressed.  The following changes have been made:  No change  Labs/ tests ordered today include:  No orders of the defined types were placed in this encounter.   Recommend 150 minutes/week of aerobic exercise Low fat, low carb, high fiber diet recommended  Disposition:   FU in May 2021   Signed, Damon Grooms, MD  04/24/2019 10:25 AM    [facility] [place], [place], [place]  [address] Phone: [phone]; Fax: [phone]

## 2019-04-24 ENCOUNTER — Encounter: Payer: Self-pay | Admitting: Interventional Cardiology

## 2019-04-24 ENCOUNTER — Ambulatory Visit (INDEPENDENT_AMBULATORY_CARE_PROVIDER_SITE_OTHER): Payer: Medicare HMO | Admitting: Interventional Cardiology

## 2019-04-24 ENCOUNTER — Other Ambulatory Visit: Payer: Self-pay

## 2019-04-24 VITALS — BP 160/82 | HR 58 | Ht 67.0 in | Wt 169.6 lb

## 2019-04-24 DIAGNOSIS — I252 Old myocardial infarction: Secondary | ICD-10-CM

## 2019-04-24 DIAGNOSIS — I25118 Atherosclerotic heart disease of native coronary artery with other forms of angina pectoris: Secondary | ICD-10-CM

## 2019-04-24 DIAGNOSIS — Z01810 Encounter for preprocedural cardiovascular examination: Secondary | ICD-10-CM

## 2019-04-24 MED ORDER — NITROGLYCERIN 0.4 MG SL SUBL: 0.4000 mg | SUBLINGUAL_TABLET | SUBLINGUAL | 3 refills | Status: AC | PRN

## 2019-04-24 NOTE — Telephone Encounter (Signed)
No further testing needed before surgery.  It appears that he does not need to stop aspirin or plavix.  I sent a message to Dr. Essie Hart to confirm this today.  Stent was paced on 01/11/2019

## 2019-04-24 NOTE — Patient Instructions (Addendum)
Medication Instructions:  Your physician has recommended you make the following change in your medication:  START NITROGLYCERIN 0.4MG  AS DIRECTED/AS NEEDED FOR CHEST PAIN  If you need a refill on your cardiac medications before your next appointment, please call your pharmacy.   Lab work: None Ordered  If you have labs (blood work) drawn today and your tests are completely normal, you will receive your results only by:  [place] (if you have MyChart) OR  A paper copy in the mail If you have any lab test that is abnormal or we need to change your treatment, we will call you to review the results.  Testing/Procedures: None Ordered  Follow-Up: At [facility], you and your health needs are our priority.  As part of our continuing mission to provide you with exceptional heart care, we have created designated Provider Care Teams.  These Care Teams include your primary Cardiologist (physician) and Advanced Practice Providers (APPs -  Physician Assistants and Nurse Practitioners) who all work together to provide you with the care you need, when you need it. You will need a follow up appointment in 8 months.  Please call our office 2 months in advance to schedule this appointment.  You may see Dr. Irish Lack or one of the following Advanced Practice Providers on your designated Care Team:   Lyda Jester, PA-C Dayna Dunn, PA-C  Ermalinda Barrios, PA-C  Any Other Special Instructions Will Be Listed Below   Heart-Healthy Eating Plan Heart-healthy meal planning includes:  Eating less unhealthy fats.  Eating more healthy fats.  Making other changes in your diet. Talk with your doctor or a diet specialist (dietitian) to create an eating plan that is right for you. What is my plan? Your doctor may recommend an eating plan that includes:  Total fat: ______% or less of total calories a day.  Saturated fat: ______% or less of total calories a day.  Cholesterol: less than  _________mg a day. What are tips for following this plan? Cooking Avoid frying your food. Try to bake, boil, grill, or broil it instead. You can also reduce fat by:  Removing the skin from poultry.  Removing all visible fats from meats.  Steaming vegetables in water or broth. Meal planning   At meals, divide your plate into four equal parts: ? Fill one-half of your plate with vegetables and green salads. ? Fill one-fourth of your plate with whole grains. ? Fill one-fourth of your plate with lean protein foods.  Eat 4-5 servings of vegetables per day. A serving of vegetables is: ? 1 cup of raw or cooked vegetables. ? 2 cups of raw leafy greens.  Eat 4-5 servings of fruit per day. A serving of fruit is: ? 1 medium whole fruit. ?  cup of dried fruit. ?  cup of fresh, frozen, or canned fruit. ?  cup of 100% fruit juice.  Eat more foods that have soluble fiber. These are apples, broccoli, carrots, beans, peas, and barley. Try to get 20-30 g of fiber per day.  Eat 4-5 servings of nuts, legumes, and seeds per week: ? 1 serving of dried beans or legumes equals  cup after being cooked. ? 1 serving of nuts is  cup. ? 1 serving of seeds equals 1 tablespoon. General information  Eat more home-cooked food. Eat less restaurant, buffet, and fast food.  Limit or avoid alcohol.  Limit foods that are high in starch and sugar.  Avoid fried foods.  Lose weight if you are  overweight.  Keep track of how much salt (sodium) you eat. This is important if you have high blood pressure. Ask your doctor to tell you more about this.  Try to add vegetarian meals each week. Fats  Choose healthy fats. These include olive oil and canola oil, flaxseeds, walnuts, almonds, and seeds.  Eat more omega-3 fats. These include salmon, mackerel, sardines, tuna, flaxseed oil, and ground flaxseeds. Try to eat fish at least 2 times each week.  Check food labels. Avoid foods with trans fats or high  amounts of saturated fat.  Limit saturated fats. ? These are often found in animal products, such as meats, butter, and cream. ? These are also found in plant foods, such as palm oil, palm kernel oil, and coconut oil.  Avoid foods with partially hydrogenated oils in them. These have trans fats. Examples are stick margarine, some tub margarines, cookies, crackers, and other baked goods. What foods can I eat? Fruits All fresh, canned (in natural juice), or frozen fruits. Vegetables Fresh or frozen vegetables (raw, steamed, roasted, or grilled). Green salads. Grains Most grains. Choose whole wheat and whole grains most of the time. Rice and pasta, including brown rice and pastas made with whole wheat. Meats and other proteins Lean, well-trimmed beef, veal, pork, and lamb. Chicken and [place] without skin. All fish and shellfish. Wild duck, rabbit, pheasant, and venison. Egg whites or low-cholesterol egg substitutes. Dried beans, peas, lentils, and tofu. Seeds and most nuts. Dairy Low-fat or nonfat cheeses, including ricotta and mozzarella. Skim or 1% milk that is liquid, powdered, or evaporated. Buttermilk that is made with low-fat milk. Nonfat or low-fat yogurt. Fats and oils Non-hydrogenated (trans-free) margarines. Vegetable oils, including soybean, sesame, sunflower, olive, peanut, safflower, corn, canola, and cottonseed. Salad dressings or mayonnaise made with a vegetable oil. Beverages Mineral water. Coffee and tea. Diet carbonated beverages. Sweets and desserts Sherbet, gelatin, and fruit ice. Small amounts of dark chocolate. Limit all sweets and desserts. Seasonings and condiments All seasonings and condiments. The items listed above may not be a complete list of foods and drinks you can eat. Contact a dietitian for more options. What foods should I avoid? Fruits Canned fruit in heavy syrup. Fruit in cream or butter sauce. Fried fruit. Limit coconut. Vegetables Vegetables cooked  in cheese, cream, or butter sauce. Fried vegetables. Grains Breads that are made with saturated or trans fats, oils, or whole milk. Croissants. Sweet rolls. Donuts. High-fat crackers, such as cheese crackers. Meats and other proteins Fatty meats, such as hot dogs, ribs, sausage, bacon, rib-eye roast or steak. High-fat deli meats, such as salami and bologna. Caviar. Domestic duck and goose. Organ meats, such as liver. Dairy Cream, sour cream, cream cheese, and creamed cottage cheese. Whole-milk cheeses. Whole or 2% milk that is liquid, evaporated, or condensed. Whole buttermilk. Cream sauce or high-fat cheese sauce. Yogurt that is made from whole milk. Fats and oils Meat fat, or shortening. Cocoa butter, hydrogenated oils, palm oil, coconut oil, palm kernel oil. Solid fats and shortenings, including bacon fat, salt pork, lard, and butter. Nondairy cream substitutes. Salad dressings with cheese or sour cream. Beverages Regular sodas and juice drinks with added sugar. Sweets and desserts Frosting. Pudding. Cookies. Cakes. Pies. Milk chocolate or white chocolate. Buttered syrups. Full-fat ice cream or ice cream drinks. The items listed above may not be a complete list of foods and drinks to avoid. Contact a dietitian for more information. Summary  Heart-healthy meal planning includes eating less unhealthy fats, eating  more healthy fats, and making other changes in your diet.  Eat a balanced diet. This includes fruits and vegetables, low-fat or nonfat dairy, lean protein, nuts and legumes, whole grains, and heart-healthy oils and fats. This information is not intended to replace advice given to you by your health care provider. Make sure you discuss any questions you have with your health care provider. Document Released: 02/01/2012 Document Revised: 10/06/2017 Document Reviewed: 09/09/2017 Elsevier Patient Education  2020 [employer].

## 2019-04-25 DIAGNOSIS — I2111 ST elevation (STEMI) myocardial infarction involving right coronary artery: Secondary | ICD-10-CM | POA: Diagnosis not present

## 2019-04-25 DIAGNOSIS — Z87891 Personal history of nicotine dependence: Secondary | ICD-10-CM | POA: Diagnosis not present

## 2019-04-26 DIAGNOSIS — Z87891 Personal history of nicotine dependence: Secondary | ICD-10-CM | POA: Diagnosis not present

## 2019-04-26 DIAGNOSIS — I2111 ST elevation (STEMI) myocardial infarction involving right coronary artery: Secondary | ICD-10-CM | POA: Diagnosis not present

## 2019-04-30 DIAGNOSIS — I2111 ST elevation (STEMI) myocardial infarction involving right coronary artery: Secondary | ICD-10-CM | POA: Diagnosis not present

## 2019-04-30 DIAGNOSIS — Z87891 Personal history of nicotine dependence: Secondary | ICD-10-CM | POA: Diagnosis not present

## 2019-05-02 DIAGNOSIS — I2111 ST elevation (STEMI) myocardial infarction involving right coronary artery: Secondary | ICD-10-CM | POA: Diagnosis not present

## 2019-05-02 DIAGNOSIS — Z87891 Personal history of nicotine dependence: Secondary | ICD-10-CM | POA: Diagnosis not present

## 2019-05-03 DIAGNOSIS — I2111 ST elevation (STEMI) myocardial infarction involving right coronary artery: Secondary | ICD-10-CM | POA: Diagnosis not present

## 2019-05-03 DIAGNOSIS — Z87891 Personal history of nicotine dependence: Secondary | ICD-10-CM | POA: Diagnosis not present

## 2019-05-04 ENCOUNTER — Ambulatory Visit: Payer: Medicare HMO | Admitting: Interventional Cardiology

## 2019-05-07 DIAGNOSIS — I2111 ST elevation (STEMI) myocardial infarction involving right coronary artery: Secondary | ICD-10-CM | POA: Diagnosis not present

## 2019-05-07 DIAGNOSIS — Z87891 Personal history of nicotine dependence: Secondary | ICD-10-CM | POA: Diagnosis not present

## 2019-05-24 ENCOUNTER — Other Ambulatory Visit: Payer: Self-pay | Admitting: Interventional Cardiology

## 2019-05-24 MED ORDER — CLOPIDOGREL BISULFATE 75 MG PO TABS: 75.0000 mg | ORAL_TABLET | Freq: Every day | ORAL | 3 refills | Status: DC

## 2019-05-30 DIAGNOSIS — Z23 Encounter for immunization: Secondary | ICD-10-CM | POA: Diagnosis not present

## 2019-05-30 DIAGNOSIS — I2510 Atherosclerotic heart disease of native coronary artery without angina pectoris: Secondary | ICD-10-CM | POA: Diagnosis not present

## 2019-05-30 DIAGNOSIS — I10 Essential (primary) hypertension: Secondary | ICD-10-CM | POA: Diagnosis not present

## 2019-06-20 ENCOUNTER — Telehealth: Payer: Self-pay | Admitting: Cardiovascular Disease

## 2019-06-20 NOTE — Telephone Encounter (Signed)
Phone call from Damon Sanders, DDS Pt is in her office currently and has an abscessed tooth as well as a decayed tooth beside it  He will need a root canal and a crown in the next several days He is on abx.  He had a STEMI with stenting at [facility]   I discussed with the dentist.  He is on aspirin and Plavix.  Dr. Tamala Julian is comfortable doing these procedures with the patient on aspirin and Plavix and he will not have to hold these medications.  Given the urgency of the situation and the fact that he has an abscess, he will need to go ahead and have these treated in the next day or so will be at risk of developing further complications from the tooth abscess including but not limited to endocarditis. She does not anticipate a significant bleeding.  She will proceed with treatment of the dental abscess as well as the crown procedure without interrupting aspirin and Plavix. The patient is already on antibiotics.     Mertie Moores, MD  06/20/2019 3:09 PM    [facility] [place],  [place], [place]  [address] Phone: [phone]; Fax: [phone]) [phone]     .

## 2019-07-23 DIAGNOSIS — M545 Low back pain: Secondary | ICD-10-CM | POA: Diagnosis not present

## 2019-07-23 DIAGNOSIS — M25551 Pain in right hip: Secondary | ICD-10-CM | POA: Diagnosis not present

## 2019-07-28 IMAGING — US US THYROID
1 series · 12 of 25 positions shown · non-contrast
Comparison: 11/07/2017

CLINICAL DATA: Multinodular goiter. History of left
hemithyroidectomy. Right inferior dominant thyroid nodule has been
previously biopsied.

EXAM:
THYROID ULTRASOUND
TECHNIQUE: Ultrasound examination of the thyroid gland and adjacent soft
tissues was performed.

[Series 1: us thyroid · 0.06mm/px · 12 of 34 slices shown]
[im 2/34]
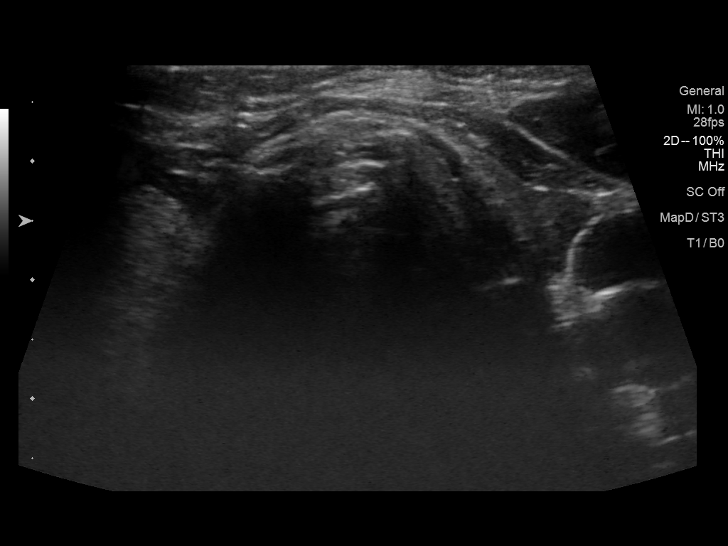
[im 5/34]
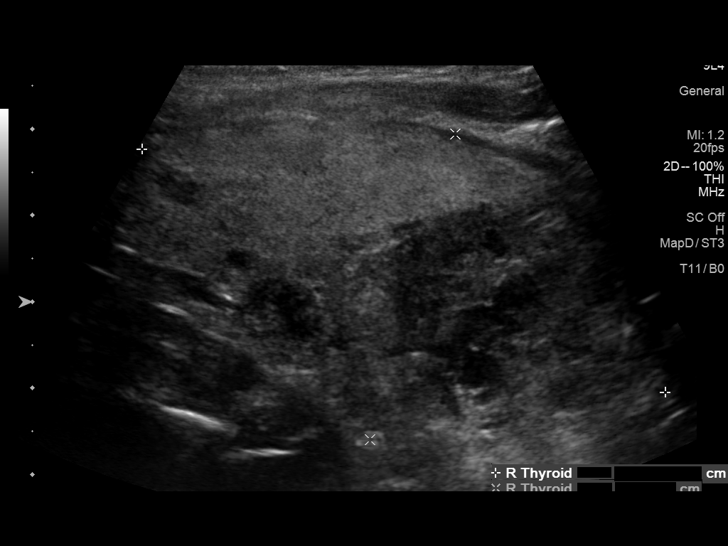
[im 7/34]
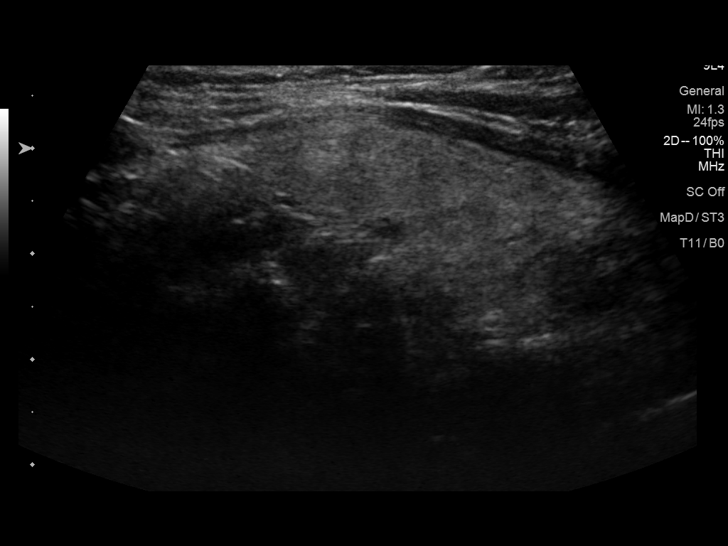
[im 10/34]
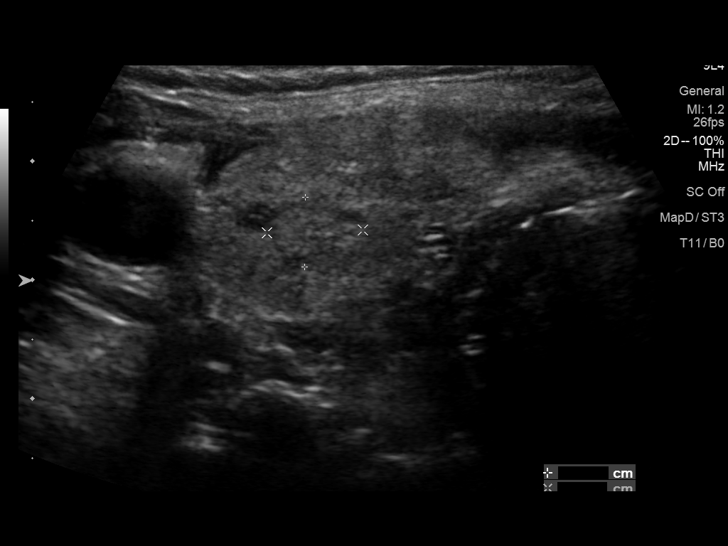
[im 13/34]
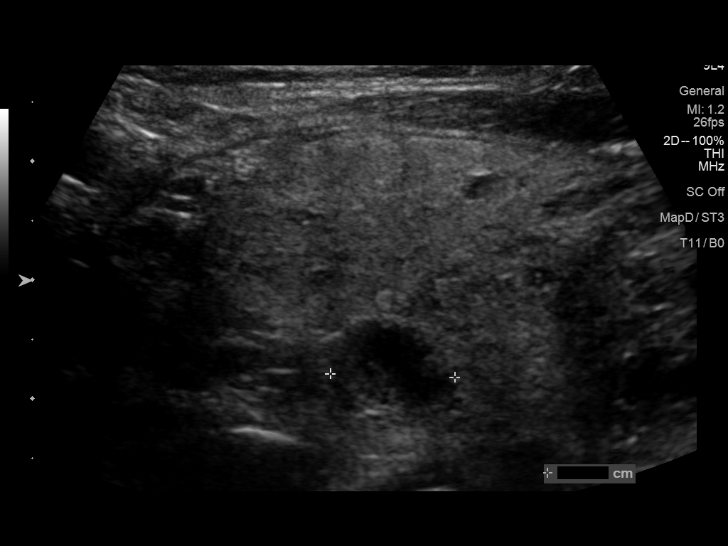
[im 16/34]
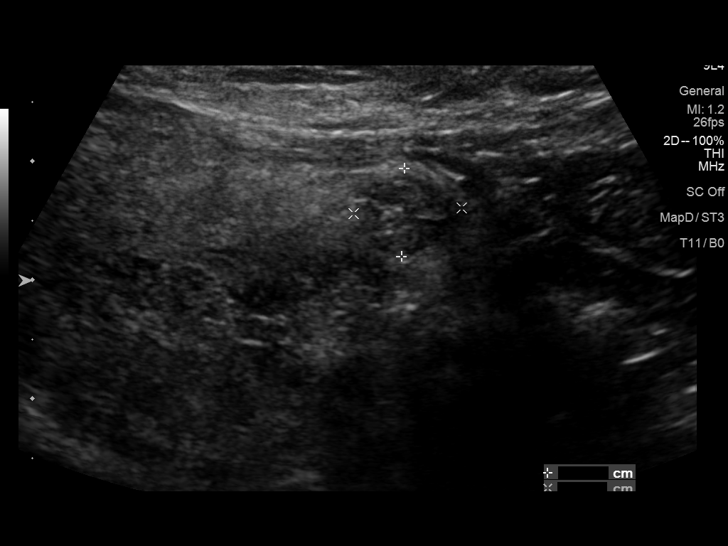
[im 18/34]
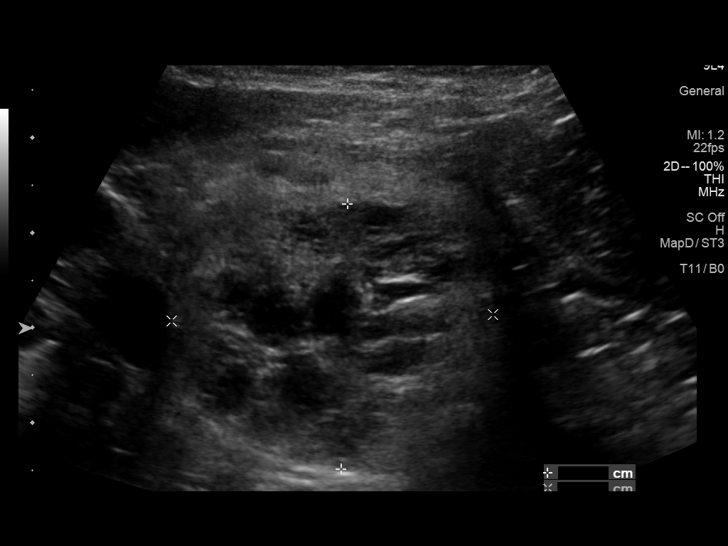
[im 21/34]
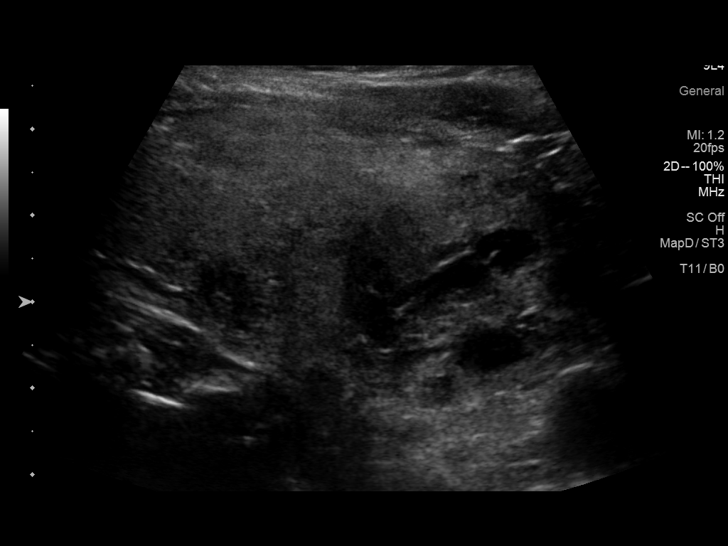
[im 24/34]
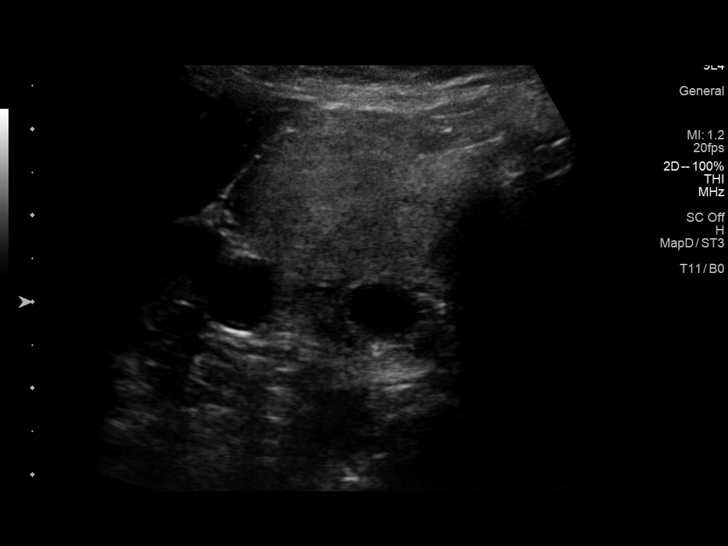
[im 27/34]
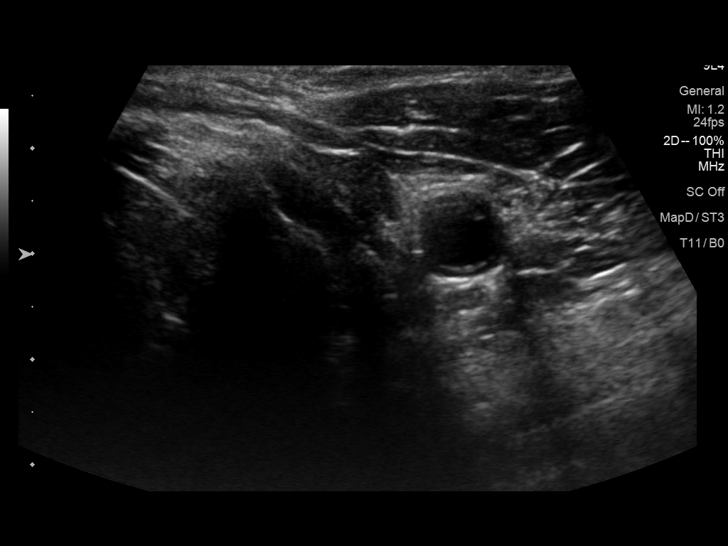
[im 29/34]
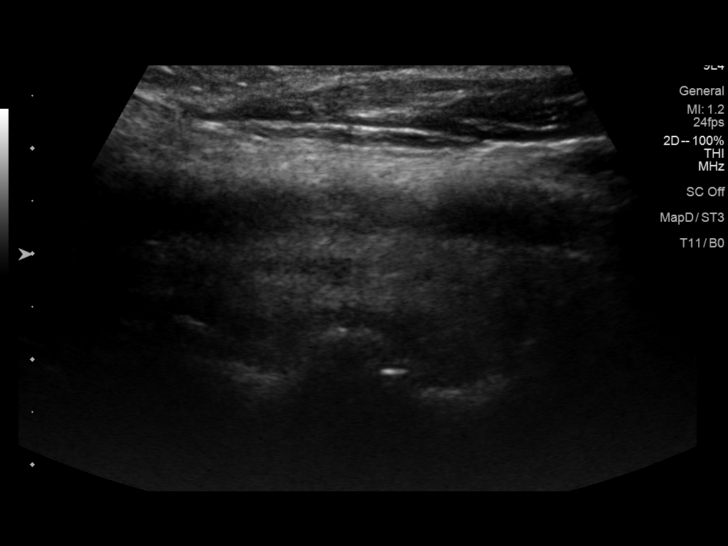
[im 32/34]
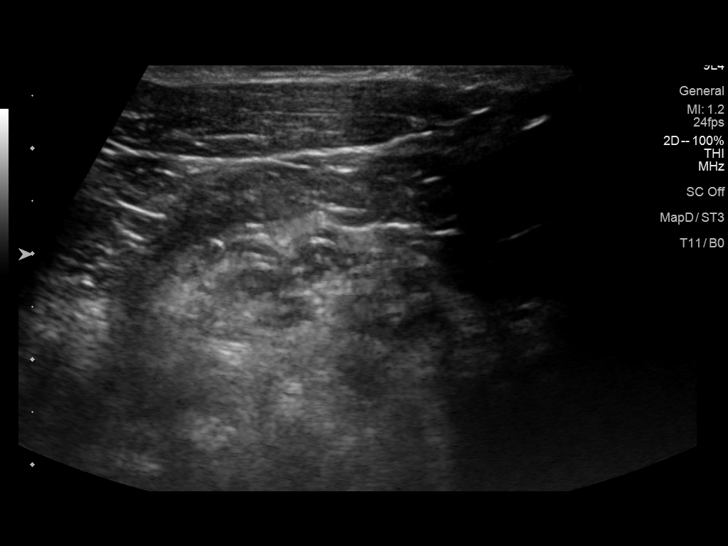

[12 of 25 positions shown; findings below may reference images not displayed]

FINDINGS: Parenchymal Echotexture: Moderately heterogenous

Isthmus: 0.6 cm, previously 0.6 cm

Right lobe: 6.7 x 3.7 x 2.7 cm, previously 5.5 x 2.5 x 3.6 cm

Left lobe: Surgically removed

_________________________________________________________

Estimated total number of nodules >/= 1 cm: 3

Number of spongiform nodules >/=  2 cm not described below (TR1): 0

Number of mixed cystic and solid nodules >/= 1.5 cm not described
below (TR2): 0

_________________________________________________________

Nodule #1 is an isoechoic nodule in the superior right thyroid lobe
that measures 0.9 x 0.6 x 0.8 cm. This is a TR 3 nodule that does
not meet criteria for biopsy or dedicated follow-up.

Nodule # 2:

Prior biopsy: No

Location: Right; Superior

Maximum size: 1.4 cm; Other 2 dimensions: 0.7 x 1.0 cm, previously,
0.9 x 0.7 x 0.7 cm

Composition: cannot determine (2)

Echogenicity: hypoechoic (2)

Shape: not taller-than-wide (0)

Margins: ill-defined (0)

Echogenic foci: none (0)

ACR TI-RADS total points: 4.

ACR TI-RADS risk category:  TR4 (4-6 points).

Significant change in size (>/= 20% in two dimensions and minimal
increase of 2 mm): Yes

Change in features: No

Change in ACR TI-RADS risk category: No

ACR TI-RADS recommendations:

*Given size (>/= 1 - 1.4 cm) and appearance, a follow-up ultrasound
in 1 year should be considered based on TI-RADS criteria.

_________________________________________________________

Nodule # 3:

Prior biopsy: No

Location: Right; Inferior

Maximum size: 1.3 cm; Other 2 dimensions: 1.1 x 1.0 cm, previously,
1.3 x 1.1 x 1.1 cm

Composition: cannot determine (2)

Echogenicity: hypoechoic (2)

Shape: not taller-than-wide (0)

Margins: ill-defined (0)

Echogenic foci: none (0)

ACR TI-RADS total points: 4.

ACR TI-RADS risk category:  TR4 (4-6 points).

Significant change in size (>/= 20% in two dimensions and minimal
increase of 2 mm): No

Change in features: No

Change in ACR TI-RADS risk category: No

ACR TI-RADS recommendations:

*Given size (>/= 1 - 1.4 cm) and appearance, a follow-up ultrasound
in 1 year should be considered based on TI-RADS criteria.

_________________________________________________________

Nodule # 4:

Prior biopsy: No

Location: Right; Inferior

Maximum size: 0.9 cm; Other 2 dimensions: 0.7 x 0.9 cm, previously,
1.2 x 0.9 x 1.1 cm

Composition: solid/almost completely solid (2)

Echogenicity: hypoechoic (2)

Shape: not taller-than-wide (0)

Margins: ill-defined (0)

Echogenic foci: none (0)

ACR TI-RADS total points: 4.

ACR TI-RADS risk category:  TR4 (4-6 points).

Significant change in size (>/= 20% in two dimensions and minimal
increase of 2 mm): No

Change in features: No

Change in ACR TI-RADS risk category: No

ACR TI-RADS recommendations:

Given size (<0.9 cm) and appearance, this nodule does NOT meet
TI-RADS criteria for biopsy or dedicated follow-up.

_________________________________________________________

Nodule #5 is a previously biopsied heterogeneous nodule in the
inferior right thyroid lobe. The nodule measures 3.4 x 2.8 x 3.4 cm.
This nodule previously measured 3.5 x 2.7 x 3.1 cm.

Left thyroid tissue has been surgically removed. No residual tissue
in the surgical bed. No suspicious nodularity at the surgical bed.
No enlarged lymph nodes around the thyroid.
IMPRESSION: 1. Multiple right thyroid nodules.
2. Nodule #2 in the superior right thyroid lobe is poorly defined
and there may have been interval enlargement. Recommend 1 year
follow-up of this nodule.
3. Nodule #3 meets criteria for 1 year follow-up.
4. No significant change in the previously biopsied inferior right
thyroid nodule.
5. No new suspicious thyroid nodules.
6. Left hemithyroidectomy.

The above is in keeping with the ACR TI-RADS recommendations - [HOSPITAL] 3049;[DATE].

## 2019-08-19 DIAGNOSIS — Z20822 Contact with and (suspected) exposure to covid-19: Secondary | ICD-10-CM | POA: Diagnosis not present

## 2019-08-19 DIAGNOSIS — R69 Illness, unspecified: Secondary | ICD-10-CM | POA: Diagnosis not present

## 2019-08-20 DIAGNOSIS — M545 Low back pain: Secondary | ICD-10-CM | POA: Diagnosis not present

## 2019-09-13 DIAGNOSIS — M5416 Radiculopathy, lumbar region: Secondary | ICD-10-CM | POA: Diagnosis not present

## 2019-09-13 DIAGNOSIS — M545 Low back pain: Secondary | ICD-10-CM | POA: Diagnosis not present

## 2019-09-18 DIAGNOSIS — H401111 Primary open-angle glaucoma, right eye, mild stage: Secondary | ICD-10-CM | POA: Diagnosis not present

## 2019-09-18 DIAGNOSIS — H2513 Age-related nuclear cataract, bilateral: Secondary | ICD-10-CM | POA: Diagnosis not present

## 2019-09-18 DIAGNOSIS — H401122 Primary open-angle glaucoma, left eye, moderate stage: Secondary | ICD-10-CM | POA: Diagnosis not present

## 2019-09-24 ENCOUNTER — Telehealth: Payer: Self-pay | Admitting: Interventional Cardiology

## 2019-09-24 ENCOUNTER — Ambulatory Visit: Payer: Self-pay

## 2019-09-24 NOTE — Telephone Encounter (Signed)
We are recommending the COVID-19 vaccine to all of our patients. Cardiac medications (including blood thinners) should not deter anyone from being vaccinated and there is no need to hold any of those medications prior to vaccine administration.     Currently, there is a hotline to call (active 08/24/19) to schedule vaccination appointments as no walk-ins will be accepted.   Number: [phone].    If an appointment is not available please go to [email or web address] to sign up for notification when additional vaccine appointments are available.   If you have further questions or concerns about the vaccine process, please visit [email or web address] or contact your primary care physician.   

## 2019-09-30 ENCOUNTER — Ambulatory Visit: Payer: Medicare HMO | Attending: Internal Medicine

## 2019-09-30 DIAGNOSIS — Z23 Encounter for immunization: Secondary | ICD-10-CM | POA: Insufficient documentation

## 2019-09-30 NOTE — Progress Notes (Signed)
   Covid-19 Vaccination Clinic  Name:  Damon Sanders    MRN: [id number] DOB: 1946-07-22  09/30/2019  [name] was observed post Covid-19 immunization for 15 minutes without incidence. He was provided with Vaccine Information Sheet and instruction to access the V-Safe system.   [name] was instructed to call 911 with any severe reactions post vaccine: Marland Kitchen Difficulty breathing  . Swelling of your face and throat  . A fast heartbeat  . A bad rash all over your body  . Dizziness and weakness    Immunizations Administered    Name Date Dose VIS Date Route   Pfizer COVID-19 Vaccine 09/30/2019 10:06 AM 0.3 mL 07/27/2019 Intramuscular   Manufacturer: [place]   Lot: [id number]   [place]: [id number]

## 2019-10-23 ENCOUNTER — Ambulatory Visit: Payer: Medicare HMO | Attending: Internal Medicine

## 2019-10-23 DIAGNOSIS — Z23 Encounter for immunization: Secondary | ICD-10-CM | POA: Insufficient documentation

## 2019-10-23 NOTE — Progress Notes (Signed)
   Covid-19 Vaccination Clinic  Name:  LADAINIAN BIEDRON    MRN: [id number] DOB: 11-07-1945  10/23/2019  Mr. Dhawan was observed post Covid-19 immunization for 30 minutes based on pre-vaccination screening without incident. He was provided with Vaccine Information Sheet and instruction to access the V-Safe system.   Mr. Daffin was instructed to call 911 with any severe reactions post vaccine: Marland Kitchen Difficulty breathing  . Swelling of face and throat  . A fast heartbeat  . A bad rash all over body  . Dizziness and weakness   Immunizations Administered    Name Date Dose VIS Date Route   Pfizer COVID-19 Vaccine 10/23/2019 11:31 AM 0.3 mL 07/27/2019 Intramuscular   Manufacturer: [place]   Lot: [id number]   [place]: [id number]

## 2019-11-29 DIAGNOSIS — I10 Essential (primary) hypertension: Secondary | ICD-10-CM | POA: Diagnosis not present

## 2019-11-29 DIAGNOSIS — M545 Low back pain: Secondary | ICD-10-CM | POA: Diagnosis not present

## 2019-12-06 DIAGNOSIS — M791 Myalgia, unspecified site: Secondary | ICD-10-CM | POA: Diagnosis not present

## 2019-12-06 DIAGNOSIS — M545 Low back pain: Secondary | ICD-10-CM | POA: Diagnosis not present

## 2019-12-23 NOTE — Progress Notes (Signed)
Cardiology Office Note   Date:  12/24/2019   ID:  Damon Sanders, DOB 09/22/1945, MRN [id number]  PCP:  Lajean Manes, MD    No chief complaint on file.  CAD/MI  Wt Readings from Last 3 Encounters:  12/24/19 168 lb 6.4 oz (76.4 kg)  04/24/19 169 lb 9.6 oz (76.9 kg)  06/26/12 174 lb (78.9 kg)       History of Present Illness: Damon Sanders is a 74 y.o. male  had an inferior STEMI treated at [facility] on 01/11/2019.  He received one stent to the mid RCA, 3.0 x 22 mm Onyx.  He had syncope a week before the MI.  A week later, he felt "funny" and was diaphoretic.  He had teeth pain.  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  Since the time, he has done well. He had his COVID vaccines.    Denies : Chest pain. Dizziness. Leg edema. Nitroglycerin use. Orthopnea. Palpitations. Paroxysmal nocturnal dyspnea. Shortness of breath. Syncope.   Trying to eat healthy.  Eating less red meat.   He has had some back pain.  He tried prednisone. He is taking Tylenol. Voltaren cream has helped as well.     Past Medical History:  Diagnosis Date  . Hyperlipidemia   . Hypertension     Past Surgical History:  Procedure Laterality Date  . THYROIDECTOMY, PARTIAL  1999     Current Outpatient Medications  Medication Sig Dispense Refill  . aspirin 81 MG tablet Take 81 mg by mouth daily.    Marland Kitchen atorvastatin (LIPITOR) 80 MG tablet Take by mouth.    . carvedilol (COREG) 3.125 MG tablet     . clopidogrel (PLAVIX) 75 MG tablet Take 1 tablet (75 mg total) by mouth daily. 90 tablet 3  . fish oil-omega-3 fatty acids 1000 MG capsule Take 2 g by mouth daily.    Marland Kitchen losartan (COZAAR) 100 MG tablet Take 100 mg by mouth daily.    . nitroGLYCERIN (NITROSTAT) 0.4 MG SL tablet Place 1 tablet (0.4 mg total) under the tongue every 5 (five) minutes as needed for chest pain. 90 tablet 3   No current facility-administered  medications for this visit.    Allergies:   Caine-1 [lidocaine], Penicillins, Procaine, and Lisinopril    Social History:  The patient  reports that he has quit smoking. His smoking use included cigarettes. He quit after 10.00 years of use. He has never used smokeless tobacco. He reports current alcohol use of about 8.0 standard drinks of alcohol per week. He reports that he does not use drugs.   Family History:  The patient's family history includes Colon cancer in his father.    ROS:  Please see the history of present illness.   Otherwise, review of systems are positive for back pain.   All other systems are reviewed and negative.    PHYSICAL EXAM: VS:  BP 136/64   Pulse 69   Ht 5\' 7"  (1.702 m)   Wt 168 lb 6.4 oz (76.4 kg)   SpO2 98%   BMI 26.38 kg/m  , BMI Body mass index is 26.38 kg/m. GEN: Well nourished, well developed, in no acute distress  HEENT: normal  Neck: no JVD, carotid bruits, or masses Cardiac: RRR; no murmurs, rubs, or gallops,no edema  Respiratory:  clear to auscultation bilaterally, normal work of breathing GI: soft, nontender, nondistended, +  BS MS: no deformity or atrophy  Skin: warm and dry, no rash Neuro:  Strength and sensation are intact Psych: euthymic mood, full affect   EKG:   The ekg ordered 04/25/19 demonstrates NSR, RBBB   Recent Labs: No results found for requested labs within last 8760 hours.   Lipid Panel No results found for: CHOL, TRIG, HDL, CHOLHDL, VLDL, LDLCALC, LDLDIRECT   Other studies Reviewed: Additional studies/ records that were reviewed today with results demonstrating: PMD labs reviewed.   ASSESSMENT AND PLAN:  1. CAD/old MI: LDL 48 in 02/2019. Continue aggressive secondary prevention. He has been on DAPT.  OK to stop aspirin in June 2021.  Continue clopidogrel.  No CHF symptoms. 2. Back pain: Limiting exercise.  3. HTN: The current medical regimen is effective;  continue present plan and medications.  He should sit and  relax for about 10 minutes before he checks his blood pressures at home.  Low-salt diet recommended as well.  Avoid processed foods. 4. Whole food plant based diet recommended.    Current medicines are reviewed at length with the patient today.  The patient concerns regarding his medicines were addressed.  The following changes have been made:  No change  Labs/ tests ordered today include:  No orders of the defined types were placed in this encounter.   Recommend 150 minutes/week of aerobic exercise Low fat, low carb, high fiber diet recommended  Disposition:   FU in 1 year   Signed, Larae Grooms, MD  12/24/2019 9:33 AM    [facility] [address], [place], [place]  [address] Phone: [phone]; Fax: [phone]

## 2019-12-24 ENCOUNTER — Encounter: Payer: Self-pay | Admitting: Interventional Cardiology

## 2019-12-24 ENCOUNTER — Ambulatory Visit: Payer: Medicare HMO | Admitting: Interventional Cardiology

## 2019-12-24 ENCOUNTER — Other Ambulatory Visit: Payer: Self-pay

## 2019-12-24 VITALS — BP 136/64 | HR 69 | Ht 67.0 in | Wt 168.4 lb

## 2019-12-24 DIAGNOSIS — I10 Essential (primary) hypertension: Secondary | ICD-10-CM | POA: Diagnosis not present

## 2019-12-24 DIAGNOSIS — I25118 Atherosclerotic heart disease of native coronary artery with other forms of angina pectoris: Secondary | ICD-10-CM

## 2019-12-24 DIAGNOSIS — I252 Old myocardial infarction: Secondary | ICD-10-CM | POA: Diagnosis not present

## 2019-12-24 NOTE — Patient Instructions (Signed)
Medication Instructions:  Your physician has recommended you make the following change in your medication:   1. STOP: Aspirin in JUNE 2021  2. CONTINUE: clopidogrel (plavix)   *If you need a refill on your cardiac medications before your next appointment, please call your pharmacy*   Lab Work: None ordered  If you have labs (blood work) drawn today and your tests are completely normal, you will receive your results only by: Marland Kitchen MyChart Message (if you have MyChart) OR . A paper copy in the mail If you have any lab test that is abnormal or we need to change your treatment, we will call you to review the results.   Testing/Procedures: None ordered   Follow-Up: At [facility], you and your health needs are our priority.  As part of our continuing mission to provide you with exceptional heart care, we have created designated Provider Care Teams.  These Care Teams include your primary Cardiologist (physician) and Advanced Practice Providers (APPs -  Physician Assistants and Nurse Practitioners) who all work together to provide you with the care you need, when you need it.  We recommend signing up for the patient portal called "MyChart".  Sign up information is provided on this After Visit Summary.  MyChart is used to connect with patients for Virtual Visits (Telemedicine).  Patients are able to view lab/test results, encounter notes, upcoming appointments, etc.  Non-urgent messages can be sent to your provider as well.   To learn more about what you can do with MyChart, go to [email or web address].    Your next appointment:   12 month(s)  The format for your next appointment:   In Person  Provider:   You may see Larae Grooms, MD or one of the following Advanced Practice Providers on your designated Care Team:    Melina Copa, PA-C  Ermalinda Barrios, PA-C    Other Instructions  High-Fiber Diet Fiber, also called dietary fiber, is a type of carbohydrate that is found in  fruits, vegetables, whole grains, and beans. A high-fiber diet can have many health benefits. Your health care provider may recommend a high-fiber diet to help:  Prevent constipation. Fiber can make your bowel movements more regular.  Lower your cholesterol.  Relieve the following conditions: ? Swelling of veins in the anus (hemorrhoids). ? Swelling and irritation (inflammation) of specific areas of the digestive tract (uncomplicated diverticulosis). ? A problem of the large intestine (colon) that sometimes causes pain and diarrhea (irritable bowel syndrome, IBS).  Prevent overeating as part of a weight-loss plan.  Prevent heart disease, type 2 diabetes, and certain cancers. What is my plan? The recommended daily fiber intake in grams (g) includes:  38 g for men age 66 or younger.  30 g for men over age 33.  7 g for women age 59 or younger.  21 g for women over age 69. You can get the recommended daily intake of dietary fiber by:  Eating a variety of fruits, vegetables, grains, and beans.  Taking a fiber supplement, if it is not possible to get enough fiber through your diet. What do I need to know about a high-fiber diet?  It is better to get fiber through food sources rather than from fiber supplements. There is not a lot of research about how effective supplements are.  Always check the fiber content on the nutrition facts label of any prepackaged food. Look for foods that contain 5 g of fiber or more per serving.  Talk with a diet  and nutrition specialist (dietitian) if you have questions about specific foods that are recommended or not recommended for your medical condition, especially if those foods are not listed below.  Gradually increase how much fiber you consume. If you increase your intake of dietary fiber too quickly, you may have bloating, cramping, or gas.  Drink plenty of water. Water helps you to digest fiber. What are tips for following this plan?  Eat a  wide variety of high-fiber foods.  Make sure that half of the grains that you eat each day are whole grains.  Eat breads and cereals that are made with whole-grain flour instead of refined flour or white flour.  Eat brown rice, bulgur wheat, or millet instead of white rice.  Start the day with a breakfast that is high in fiber, such as a cereal that contains 5 g of fiber or more per serving.  Use beans in place of meat in soups, salads, and pasta dishes.  Eat high-fiber snacks, such as berries, raw vegetables, nuts, and popcorn.  Choose whole fruits and vegetables instead of processed forms like juice or sauce. What foods can I eat?  Fruits Berries. Pears. Apples. Oranges. Avocado. Prunes and raisins. Dried figs. Vegetables Sweet potatoes. Spinach. Kale. Artichokes. Cabbage. Broccoli. Cauliflower. Green peas. Carrots. Squash. Grains Whole-grain breads. Multigrain cereal. Oats and oatmeal. Brown rice. Barley. Bulgur wheat. [place]. Quinoa. Bran muffins. Popcorn. Rye wafer crackers. Meats and other proteins Navy, kidney, and pinto beans. Soybeans. Split peas. Lentils. Nuts and seeds. Dairy Fiber-fortified yogurt. Beverages Fiber-fortified soy milk. Fiber-fortified orange juice. Other foods Fiber bars. The items listed above may not be a complete list of recommended foods and beverages. Contact a dietitian for more options. What foods are not recommended? Fruits Fruit juice. Cooked, strained fruit. Vegetables Fried potatoes. Canned vegetables. Well-cooked vegetables. Grains White bread. Pasta made with refined flour. White rice. Meats and other proteins Fatty cuts of meat. Fried chicken or fried fish. Dairy Milk. Yogurt. Cream cheese. Sour cream. Fats and oils Butters. Beverages Soft drinks. Other foods Cakes and pastries. The items listed above may not be a complete list of foods and beverages to avoid. Contact a dietitian for more information. Summary  Fiber is a  type of carbohydrate. It is found in fruits, vegetables, whole grains, and beans.  There are many health benefits of eating a high-fiber diet, such as preventing constipation, lowering blood cholesterol, helping with weight loss, and reducing your risk of heart disease, diabetes, and certain cancers.  Gradually increase your intake of fiber. Increasing too fast can result in cramping, bloating, and gas. Drink plenty of water while you increase your fiber.  The best sources of fiber include whole fruits and vegetables, whole grains, nuts, seeds, and beans. This information is not intended to replace advice given to you by your health care provider. Make sure you discuss any questions you have with your health care provider. Document Revised: 06/06/2017 Document Reviewed: 06/06/2017 Elsevier Patient Education  2020 [employer].

## 2020-01-10 DIAGNOSIS — Z79899 Other long term (current) drug therapy: Secondary | ICD-10-CM | POA: Diagnosis not present

## 2020-01-10 DIAGNOSIS — I10 Essential (primary) hypertension: Secondary | ICD-10-CM | POA: Diagnosis not present

## 2020-02-08 ENCOUNTER — Other Ambulatory Visit: Payer: Self-pay

## 2020-02-08 MED ORDER — CARVEDILOL 3.125 MG PO TABS: 3.1250 mg | ORAL_TABLET | Freq: Two times a day (BID) | ORAL | 3 refills | Status: DC

## 2020-02-08 MED ORDER — ATORVASTATIN CALCIUM 80 MG PO TABS: 80.0000 mg | ORAL_TABLET | Freq: Every day | ORAL | 3 refills | Status: DC

## 2020-03-17 DIAGNOSIS — H401111 Primary open-angle glaucoma, right eye, mild stage: Secondary | ICD-10-CM | POA: Diagnosis not present

## 2020-03-17 DIAGNOSIS — H401122 Primary open-angle glaucoma, left eye, moderate stage: Secondary | ICD-10-CM | POA: Diagnosis not present

## 2020-03-17 DIAGNOSIS — H2513 Age-related nuclear cataract, bilateral: Secondary | ICD-10-CM | POA: Diagnosis not present

## 2020-04-09 DIAGNOSIS — I10 Essential (primary) hypertension: Secondary | ICD-10-CM | POA: Diagnosis not present

## 2020-04-10 DIAGNOSIS — H401122 Primary open-angle glaucoma, left eye, moderate stage: Secondary | ICD-10-CM | POA: Diagnosis not present

## 2020-04-10 DIAGNOSIS — H2512 Age-related nuclear cataract, left eye: Secondary | ICD-10-CM | POA: Diagnosis not present

## 2020-04-29 DIAGNOSIS — K219 Gastro-esophageal reflux disease without esophagitis: Secondary | ICD-10-CM | POA: Diagnosis not present

## 2020-04-29 DIAGNOSIS — L299 Pruritus, unspecified: Secondary | ICD-10-CM | POA: Diagnosis not present

## 2020-04-29 DIAGNOSIS — I10 Essential (primary) hypertension: Secondary | ICD-10-CM | POA: Diagnosis not present

## 2020-04-29 DIAGNOSIS — Z23 Encounter for immunization: Secondary | ICD-10-CM | POA: Diagnosis not present

## 2020-04-29 DIAGNOSIS — R202 Paresthesia of skin: Secondary | ICD-10-CM | POA: Diagnosis not present

## 2020-05-01 DIAGNOSIS — E7800 Pure hypercholesterolemia, unspecified: Secondary | ICD-10-CM | POA: Diagnosis not present

## 2020-05-01 DIAGNOSIS — I2510 Atherosclerotic heart disease of native coronary artery without angina pectoris: Secondary | ICD-10-CM | POA: Diagnosis not present

## 2020-05-01 DIAGNOSIS — I10 Essential (primary) hypertension: Secondary | ICD-10-CM | POA: Diagnosis not present

## 2020-05-07 ENCOUNTER — Other Ambulatory Visit: Payer: Self-pay | Admitting: Interventional Cardiology

## 2020-06-05 DIAGNOSIS — E7800 Pure hypercholesterolemia, unspecified: Secondary | ICD-10-CM | POA: Diagnosis not present

## 2020-06-05 DIAGNOSIS — I2510 Atherosclerotic heart disease of native coronary artery without angina pectoris: Secondary | ICD-10-CM | POA: Diagnosis not present

## 2020-06-05 DIAGNOSIS — I10 Essential (primary) hypertension: Secondary | ICD-10-CM | POA: Diagnosis not present

## 2020-06-18 DIAGNOSIS — K219 Gastro-esophageal reflux disease without esophagitis: Secondary | ICD-10-CM | POA: Diagnosis not present

## 2020-06-18 DIAGNOSIS — Z1389 Encounter for screening for other disorder: Secondary | ICD-10-CM | POA: Diagnosis not present

## 2020-06-18 DIAGNOSIS — Z1159 Encounter for screening for other viral diseases: Secondary | ICD-10-CM | POA: Diagnosis not present

## 2020-06-18 DIAGNOSIS — E042 Nontoxic multinodular goiter: Secondary | ICD-10-CM | POA: Diagnosis not present

## 2020-06-18 DIAGNOSIS — Z0000 Encounter for general adult medical examination without abnormal findings: Secondary | ICD-10-CM | POA: Diagnosis not present

## 2020-06-18 DIAGNOSIS — Z79899 Other long term (current) drug therapy: Secondary | ICD-10-CM | POA: Diagnosis not present

## 2020-06-18 DIAGNOSIS — I10 Essential (primary) hypertension: Secondary | ICD-10-CM | POA: Diagnosis not present

## 2020-06-18 DIAGNOSIS — E7800 Pure hypercholesterolemia, unspecified: Secondary | ICD-10-CM | POA: Diagnosis not present

## 2020-06-27 DIAGNOSIS — E042 Nontoxic multinodular goiter: Secondary | ICD-10-CM | POA: Diagnosis not present

## 2020-07-19 ENCOUNTER — Ambulatory Visit: Payer: Medicare HMO | Attending: Internal Medicine

## 2020-07-19 DIAGNOSIS — Z23 Encounter for immunization: Secondary | ICD-10-CM

## 2020-07-19 NOTE — Progress Notes (Signed)
   Covid-19 Vaccination Clinic  Name:  Damon Sanders    MRN: [id number] DOB: Mar 25, 1946  07/19/2020  Damon Sanders was observed post Covid-19 immunization for 15 minutes without incident. He was provided with Vaccine Information Sheet and instruction to access the V-Safe system.   Damon Sanders was instructed to call 911 with any severe reactions post vaccine: Marland Kitchen Difficulty breathing  . Swelling of face and throat  . A fast heartbeat  . A bad rash all over body  . Dizziness and weakness   Immunizations Administered    Name Date Dose VIS Date Route   Pfizer COVID-19 Vaccine 07/19/2020 11:15 AM 0.3 mL 06/04/2020 Intramuscular   Manufacturer: [place]   Lot: [id number]   NDC: [address]-6

## 2020-07-30 DIAGNOSIS — I2510 Atherosclerotic heart disease of native coronary artery without angina pectoris: Secondary | ICD-10-CM | POA: Diagnosis not present

## 2020-07-30 DIAGNOSIS — I10 Essential (primary) hypertension: Secondary | ICD-10-CM | POA: Diagnosis not present

## 2020-07-30 DIAGNOSIS — E7800 Pure hypercholesterolemia, unspecified: Secondary | ICD-10-CM | POA: Diagnosis not present

## 2020-07-30 DIAGNOSIS — K219 Gastro-esophageal reflux disease without esophagitis: Secondary | ICD-10-CM | POA: Diagnosis not present

## 2020-07-31 DIAGNOSIS — H2511 Age-related nuclear cataract, right eye: Secondary | ICD-10-CM | POA: Diagnosis not present

## 2020-07-31 DIAGNOSIS — H401111 Primary open-angle glaucoma, right eye, mild stage: Secondary | ICD-10-CM | POA: Diagnosis not present

## 2020-08-25 DIAGNOSIS — E7800 Pure hypercholesterolemia, unspecified: Secondary | ICD-10-CM | POA: Diagnosis not present

## 2020-08-25 DIAGNOSIS — I2510 Atherosclerotic heart disease of native coronary artery without angina pectoris: Secondary | ICD-10-CM | POA: Diagnosis not present

## 2020-08-25 DIAGNOSIS — K219 Gastro-esophageal reflux disease without esophagitis: Secondary | ICD-10-CM | POA: Diagnosis not present

## 2020-08-25 DIAGNOSIS — I10 Essential (primary) hypertension: Secondary | ICD-10-CM | POA: Diagnosis not present

## 2020-10-21 ENCOUNTER — Other Ambulatory Visit: Payer: Self-pay | Admitting: Interventional Cardiology

## 2020-11-24 DIAGNOSIS — Z961 Presence of intraocular lens: Secondary | ICD-10-CM | POA: Diagnosis not present

## 2020-11-25 DIAGNOSIS — K219 Gastro-esophageal reflux disease without esophagitis: Secondary | ICD-10-CM | POA: Diagnosis not present

## 2020-11-25 DIAGNOSIS — I10 Essential (primary) hypertension: Secondary | ICD-10-CM | POA: Diagnosis not present

## 2020-11-25 DIAGNOSIS — I2510 Atherosclerotic heart disease of native coronary artery without angina pectoris: Secondary | ICD-10-CM | POA: Diagnosis not present

## 2020-11-25 DIAGNOSIS — E7800 Pure hypercholesterolemia, unspecified: Secondary | ICD-10-CM | POA: Diagnosis not present

## 2020-12-17 DIAGNOSIS — Z79899 Other long term (current) drug therapy: Secondary | ICD-10-CM | POA: Diagnosis not present

## 2020-12-17 DIAGNOSIS — I10 Essential (primary) hypertension: Secondary | ICD-10-CM | POA: Diagnosis not present

## 2020-12-31 ENCOUNTER — Other Ambulatory Visit: Payer: Self-pay

## 2020-12-31 ENCOUNTER — Ambulatory Visit: Payer: Medicare HMO | Admitting: Interventional Cardiology

## 2020-12-31 ENCOUNTER — Encounter: Payer: Self-pay | Admitting: Interventional Cardiology

## 2020-12-31 VITALS — BP 130/64 | HR 74 | Ht 67.0 in | Wt 176.6 lb

## 2020-12-31 DIAGNOSIS — I25118 Atherosclerotic heart disease of native coronary artery with other forms of angina pectoris: Secondary | ICD-10-CM | POA: Diagnosis not present

## 2020-12-31 DIAGNOSIS — I252 Old myocardial infarction: Secondary | ICD-10-CM

## 2020-12-31 DIAGNOSIS — I10 Essential (primary) hypertension: Secondary | ICD-10-CM | POA: Diagnosis not present

## 2020-12-31 MED ORDER — HYDROCHLOROTHIAZIDE 12.5 MG PO CAPS: 12.5000 mg | ORAL_CAPSULE | Freq: Every day | ORAL | 3 refills | Status: AC

## 2020-12-31 NOTE — Patient Instructions (Signed)
Medication Instructions:  Your physician recommends that you continue on your current medications as directed. Please refer to the Current Medication list given to you today.  *If you need a refill on your cardiac medications before your next appointment, please call your pharmacy*   Lab Work: None If you have labs (blood work) drawn today and your tests are completely normal, you will receive your results only by: . MyChart Message (if you have MyChart) OR . A paper copy in the mail If you have any lab test that is abnormal or we need to change your treatment, we will call you to review the results.   Testing/Procedures: None   Follow-Up: At [facility], you and your health needs are our priority.  As part of our continuing mission to provide you with exceptional heart care, we have created designated Provider Care Teams.  These Care Teams include your primary Cardiologist (physician) and Advanced Practice Providers (APPs -  Physician Assistants and Nurse Practitioners) who all work together to provide you with the care you need, when you need it.  We recommend signing up for the patient portal called "MyChart".  Sign up information is provided on this After Visit Summary.  MyChart is used to connect with patients for Virtual Visits (Telemedicine).  Patients are able to view lab/test results, encounter notes, upcoming appointments, etc.  Non-urgent messages can be sent to your provider as well.   To learn more about what you can do with MyChart, go to [email or web address].    Your next appointment:   1 year(s)  The format for your next appointment:   In Person  Provider:   You may see Jayadeep Varanasi, MD or one of the following Advanced Practice Providers on your designated Care Team:    Dayna Dunn, PA-C  Michele Lenze, PA-C    Other Instructions   High-Fiber Eating Plan Fiber, also called dietary fiber, is a type of carbohydrate. It is found foods such as fruits,  vegetables, whole grains, and beans. A high-fiber diet can have many health benefits. Your health care provider may recommend a high-fiber diet to help:  Prevent constipation. Fiber can make your bowel movements more regular.  Lower your cholesterol.  Relieve the following conditions: ? Inflammation of veins in the anus (hemorrhoids). ? Inflammation of specific areas of the digestive tract (uncomplicated diverticulosis). ? A problem of the large intestine, also called the colon, that sometimes causes pain and diarrhea (irritable bowel syndrome, or IBS).  Prevent overeating as part of a weight-loss plan.  Prevent heart disease, type 2 diabetes, and certain cancers. What are tips for following this plan? Reading food labels  Check the nutrition facts label on food products for the amount of dietary fiber. Choose foods that have 5 grams of fiber or more per serving.  The goals for recommended daily fiber intake include: ? Men (age 50 or younger): 34-38 g. ? Men (over age 50): 28-34 g. ? Women (age 50 or younger): 25-28 g. ? Women (over age 50): 22-25 g. Your daily fiber goal is _____________ g.   Shopping  Choose whole fruits and vegetables instead of processed forms, such as apple juice or applesauce.  Choose a wide variety of high-fiber foods such as avocados, lentils, oats, and kidney beans.  Read the nutrition facts label of the foods you choose. Be aware of foods with added fiber. These foods often have high sugar and sodium amounts per serving. Cooking  Use whole-grain flour for baking and cooking.    Cook with brown rice instead of white rice. Meal planning  Start the day with a breakfast that is high in fiber, such as a cereal that contains 5 g of fiber or more per serving.  Eat breads and cereals that are made with whole-grain flour instead of refined flour or white flour.  Eat brown rice, bulgur wheat, or millet instead of white rice.  Use beans in place of meat in  soups, salads, and pasta dishes.  Be sure that half of the grains you eat each day are whole grains. General information  You can get the recommended daily intake of dietary fiber by: ? Eating a variety of fruits, vegetables, grains, nuts, and beans. ? Taking a fiber supplement if you are not able to take in enough fiber in your diet. It is better to get fiber through food than from a supplement.  Gradually increase how much fiber you consume. If you increase your intake of dietary fiber too quickly, you may have bloating, cramping, or gas.  Drink plenty of water to help you digest fiber.  Choose high-fiber snacks, such as berries, raw vegetables, nuts, and popcorn. What foods should I eat? Fruits Berries. Pears. Apples. Oranges. Avocado. Prunes and raisins. Dried figs. Vegetables Sweet potatoes. Spinach. Kale. Artichokes. Cabbage. Broccoli. Cauliflower. Green peas. Carrots. Squash. Grains Whole-grain breads. Multigrain cereal. Oats and oatmeal. Brown rice. Barley. Bulgur wheat. Millet. Quinoa. Bran muffins. Popcorn. Rye wafer crackers. Meats and other proteins Navy beans, kidney beans, and pinto beans. Soybeans. Split peas. Lentils. Nuts and seeds. Dairy Fiber-fortified yogurt. Beverages Fiber-fortified soy milk. Fiber-fortified orange juice. Other foods Fiber bars. The items listed above may not be a complete list of recommended foods and beverages. Contact a dietitian for more information. What foods should I avoid? Fruits Fruit juice. Cooked, strained fruit. Vegetables Fried potatoes. Canned vegetables. Well-cooked vegetables. Grains White bread. Pasta made with refined flour. White rice. Meats and other proteins Fatty cuts of meat. Fried chicken or fried fish. Dairy Milk. Yogurt. Cream cheese. Sour cream. Fats and oils Butters. Beverages Soft drinks. Other foods Cakes and pastries. The items listed above may not be a complete list of foods and beverages to avoid.  Talk with your dietitian about what choices are best for you. Summary  Fiber is a type of carbohydrate. It is found in foods such as fruits, vegetables, whole grains, and beans.  A high-fiber diet has many benefits. It can help to prevent constipation, lower blood cholesterol, aid weight loss, and reduce your risk of heart disease, diabetes, and certain cancers.  Increase your intake of fiber gradually. Increasing fiber too quickly may cause cramping, bloating, and gas. Drink plenty of water while you increase the amount of fiber you consume.  The best sources of fiber include whole fruits and vegetables, whole grains, nuts, seeds, and beans. This information is not intended to replace advice given to you by your health care provider. Make sure you discuss any questions you have with your health care provider. Document Revised: 12/06/2019 Document Reviewed: 12/06/2019 Elsevier Patient Education  2021 Elsevier Inc.   

## 2020-12-31 NOTE — Progress Notes (Signed)
Cardiology Office Note   Date:  12/31/2020   ID:  Damon Sanders, DOB [birth year], MRN [id number]  PCP:  Lajean Manes, MD    No chief complaint on file.  CAD  Wt Readings from Last 3 Encounters:  12/31/20 176 lb 9.6 oz (80.1 kg)  12/24/19 168 lb 6.4 oz (76.4 kg)  04/24/19 169 lb 9.6 oz (76.9 kg)       History of Present Illness: Damon Sanders is a 75 y.o. male   had an inferior STEMI treated at [place] 01/11/2019. He received one stent to the mid RCA, 3.0 x 22 mm Onyx.  He had syncope a week before the MI. A week later, he felt "funny" and was diaphoretic. He had teeth pain.  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  He had his COVID vaccines.    Denies : Chest pain. Dizziness. Leg edema. Nitroglycerin use. Orthopnea. Palpitations. Paroxysmal nocturnal dyspnea. Shortness of breath. Syncope.   NO sx like his prior MI.      Past Medical History:  Diagnosis Date  . Hyperlipidemia   . Hypertension     Past Surgical History:  Procedure Laterality Date  . THYROIDECTOMY, PARTIAL  1999     Current Outpatient Medications  Medication Sig Dispense Refill  . atorvastatin (LIPITOR) 80 MG tablet Take 1 tablet (80 mg total) by mouth daily. Please make yearly appt in May 2022 with Dr. Irish Lack before anymore refills. Thank you 1st attempt 90 tablet 0  . carvedilol (COREG) 3.125 MG tablet Take 1 tablet (3.125 mg total) by mouth 2 (two) times daily with a meal. Please make yearly appt with Dr. Irish Lack for May 2022 before anymore refills. Thank you 1st attempt 180 tablet 0  . clopidogrel (PLAVIX) 75 MG tablet TAKE 1 TABLET (75 MG TOTAL) BY MOUTH DAILY. 90 tablet 3  . fish oil-omega-3 fatty acids 1000 MG capsule Take 2 g by mouth daily.    . hydrochlorothiazide (MICROZIDE) 12.5 MG capsule Take 1 capsule (12.5 mg total) by mouth daily. 90 capsule 3  . losartan (COZAAR) 100 MG tablet  Take 100 mg by mouth daily.    . nitroGLYCERIN (NITROSTAT) 0.4 MG SL tablet Place 1 tablet (0.4 mg total) under the tongue every 5 (five) minutes as needed for chest pain. 90 tablet 3   No current facility-administered medications for this visit.    Allergies:   Caine-1 [lidocaine], Penicillins, Procaine, and Lisinopril    Social History:  The patient  reports that he has quit smoking. His smoking use included cigarettes. He quit after 10.00 years of use. He has never used smokeless tobacco. He reports current alcohol use of about 8.0 standard drinks of alcohol per week. He reports that he does not use drugs.   Family History:  The patient's family history includes Colon cancer in his father.    ROS:  Please see the history of present illness.   Otherwise, review of systems are positive for not much exercise.   All other systems are reviewed and negative.    PHYSICAL EXAM: VS:  BP 130/64   Pulse 74   Ht 5\' 7"  (1.702 m)   Wt 176 lb 9.6 oz (80.1 kg)   SpO2 98%   BMI 27.66 kg/m  , BMI Body mass index is 27.66 kg/m. GEN: Well nourished, well developed, in no acute distress  HEENT: normal  Neck:  no JVD, carotid bruits, or masses Cardiac: RRR; no murmurs, rubs, or gallops,no edema  Respiratory:  clear to auscultation bilaterally, normal work of breathing GI: soft, nontender, nondistended, + BS MS: no deformity or atrophy  Skin: warm and dry, no rash Neuro:  Strength and sensation are intact Psych: euthymic mood, full affect   EKG:   The ekg ordered today demonstrates NSR, RBBB, short run of VT   Recent Labs: No results found for requested labs within last 8760 hours.   Lipid Panel No results found for: CHOL, TRIG, HDL, CHOLHDL, VLDL, LDLCALC, LDLDIRECT   Other studies Reviewed: Additional studies/ records that were reviewed today with results demonstrating: labs reviewed.   ASSESSMENT AND PLAN:  1. CAD/Old MI: No angina. Continue aggressive secondary prevention. LDL 40  in 2021. No CHF sx. Appears euvolemic.  2. Back pain: Not a limiting factor. 3. HTN: The current medical regimen is effective;  continue present plan and medications.  Low salt diet. 4. Whole food, plant based diet recommended.  He does eat salads.       Current medicines are reviewed at length with the patient today.  The patient concerns regarding his medicines were addressed.  The following changes have been made:  No change  Labs/ tests ordered today include:   Orders Placed This Encounter  Procedures  . EKG 12-Lead    Recommend 150 minutes/week of aerobic exercise Low fat, low carb, high fiber diet recommended  Disposition:   FU in 1 year   Signed, Larae Grooms, MD  12/31/2020 11:58 AM    [facility] [address], [place], [place]  [address] Phone: [phone]; Fax: [phone]

## 2021-01-14 DIAGNOSIS — Z961 Presence of intraocular lens: Secondary | ICD-10-CM | POA: Diagnosis not present

## 2021-01-14 DIAGNOSIS — H401111 Primary open-angle glaucoma, right eye, mild stage: Secondary | ICD-10-CM | POA: Diagnosis not present

## 2021-01-14 DIAGNOSIS — H401122 Primary open-angle glaucoma, left eye, moderate stage: Secondary | ICD-10-CM | POA: Diagnosis not present

## 2021-01-26 DIAGNOSIS — I10 Essential (primary) hypertension: Secondary | ICD-10-CM | POA: Diagnosis not present

## 2021-01-26 DIAGNOSIS — K219 Gastro-esophageal reflux disease without esophagitis: Secondary | ICD-10-CM | POA: Diagnosis not present

## 2021-01-26 DIAGNOSIS — I2510 Atherosclerotic heart disease of native coronary artery without angina pectoris: Secondary | ICD-10-CM | POA: Diagnosis not present

## 2021-01-26 DIAGNOSIS — E7800 Pure hypercholesterolemia, unspecified: Secondary | ICD-10-CM | POA: Diagnosis not present

## 2021-02-21 ENCOUNTER — Other Ambulatory Visit: Payer: Self-pay | Admitting: Interventional Cardiology

## 2021-02-24 DIAGNOSIS — Z961 Presence of intraocular lens: Secondary | ICD-10-CM | POA: Diagnosis not present

## 2021-02-24 DIAGNOSIS — H401122 Primary open-angle glaucoma, left eye, moderate stage: Secondary | ICD-10-CM | POA: Diagnosis not present

## 2021-02-24 DIAGNOSIS — H43811 Vitreous degeneration, right eye: Secondary | ICD-10-CM | POA: Diagnosis not present

## 2021-02-24 DIAGNOSIS — H401111 Primary open-angle glaucoma, right eye, mild stage: Secondary | ICD-10-CM | POA: Diagnosis not present

## 2021-02-26 ENCOUNTER — Other Ambulatory Visit: Payer: Self-pay | Admitting: Interventional Cardiology

## 2021-03-19 DIAGNOSIS — K219 Gastro-esophageal reflux disease without esophagitis: Secondary | ICD-10-CM | POA: Diagnosis not present

## 2021-03-19 DIAGNOSIS — I10 Essential (primary) hypertension: Secondary | ICD-10-CM | POA: Diagnosis not present

## 2021-03-19 DIAGNOSIS — I2510 Atherosclerotic heart disease of native coronary artery without angina pectoris: Secondary | ICD-10-CM | POA: Diagnosis not present

## 2021-03-19 DIAGNOSIS — E7800 Pure hypercholesterolemia, unspecified: Secondary | ICD-10-CM | POA: Diagnosis not present

## 2021-04-17 DIAGNOSIS — M65312 Trigger thumb, left thumb: Secondary | ICD-10-CM | POA: Diagnosis not present

## 2021-04-17 DIAGNOSIS — M25512 Pain in left shoulder: Secondary | ICD-10-CM | POA: Diagnosis not present

## 2021-05-06 ENCOUNTER — Other Ambulatory Visit: Payer: Self-pay | Admitting: Interventional Cardiology

## 2021-06-02 DIAGNOSIS — M25512 Pain in left shoulder: Secondary | ICD-10-CM | POA: Diagnosis not present

## 2021-06-02 DIAGNOSIS — M65312 Trigger thumb, left thumb: Secondary | ICD-10-CM | POA: Diagnosis not present

## 2021-06-09 DIAGNOSIS — M65312 Trigger thumb, left thumb: Secondary | ICD-10-CM | POA: Diagnosis not present

## 2021-06-23 DIAGNOSIS — E042 Nontoxic multinodular goiter: Secondary | ICD-10-CM | POA: Diagnosis not present

## 2021-06-23 DIAGNOSIS — K219 Gastro-esophageal reflux disease without esophagitis: Secondary | ICD-10-CM | POA: Diagnosis not present

## 2021-06-23 DIAGNOSIS — D539 Nutritional anemia, unspecified: Secondary | ICD-10-CM | POA: Diagnosis not present

## 2021-06-23 DIAGNOSIS — I10 Essential (primary) hypertension: Secondary | ICD-10-CM | POA: Diagnosis not present

## 2021-06-23 DIAGNOSIS — M25551 Pain in right hip: Secondary | ICD-10-CM | POA: Diagnosis not present

## 2021-06-23 DIAGNOSIS — E7800 Pure hypercholesterolemia, unspecified: Secondary | ICD-10-CM | POA: Diagnosis not present

## 2021-06-23 DIAGNOSIS — Z0000 Encounter for general adult medical examination without abnormal findings: Secondary | ICD-10-CM | POA: Diagnosis not present

## 2021-06-23 DIAGNOSIS — Z1389 Encounter for screening for other disorder: Secondary | ICD-10-CM | POA: Diagnosis not present

## 2021-06-23 DIAGNOSIS — Z79899 Other long term (current) drug therapy: Secondary | ICD-10-CM | POA: Diagnosis not present

## 2021-06-26 DIAGNOSIS — H401122 Primary open-angle glaucoma, left eye, moderate stage: Secondary | ICD-10-CM | POA: Diagnosis not present

## 2021-06-26 DIAGNOSIS — H401111 Primary open-angle glaucoma, right eye, mild stage: Secondary | ICD-10-CM | POA: Diagnosis not present

## 2021-06-26 DIAGNOSIS — Z961 Presence of intraocular lens: Secondary | ICD-10-CM | POA: Diagnosis not present

## 2021-06-26 DIAGNOSIS — H43813 Vitreous degeneration, bilateral: Secondary | ICD-10-CM | POA: Diagnosis not present

## 2021-07-01 DIAGNOSIS — E042 Nontoxic multinodular goiter: Secondary | ICD-10-CM | POA: Diagnosis not present

## 2021-07-01 DIAGNOSIS — E890 Postprocedural hypothyroidism: Secondary | ICD-10-CM | POA: Diagnosis not present

## 2021-07-30 DIAGNOSIS — Z570 Occupational exposure to noise: Secondary | ICD-10-CM | POA: Diagnosis not present

## 2021-07-30 DIAGNOSIS — H903 Sensorineural hearing loss, bilateral: Secondary | ICD-10-CM | POA: Diagnosis not present

## 2021-08-25 DIAGNOSIS — D649 Anemia, unspecified: Secondary | ICD-10-CM | POA: Diagnosis not present

## 2021-09-30 DIAGNOSIS — H43813 Vitreous degeneration, bilateral: Secondary | ICD-10-CM | POA: Diagnosis not present

## 2021-09-30 DIAGNOSIS — Z961 Presence of intraocular lens: Secondary | ICD-10-CM | POA: Diagnosis not present

## 2021-09-30 DIAGNOSIS — G43B0 Ophthalmoplegic migraine, not intractable: Secondary | ICD-10-CM | POA: Diagnosis not present

## 2021-09-30 DIAGNOSIS — H401111 Primary open-angle glaucoma, right eye, mild stage: Secondary | ICD-10-CM | POA: Diagnosis not present

## 2021-09-30 DIAGNOSIS — H401122 Primary open-angle glaucoma, left eye, moderate stage: Secondary | ICD-10-CM | POA: Diagnosis not present

## 2021-12-09 ENCOUNTER — Other Ambulatory Visit: Payer: Self-pay | Admitting: Interventional Cardiology

## 2021-12-23 DIAGNOSIS — Z79899 Other long term (current) drug therapy: Secondary | ICD-10-CM | POA: Diagnosis not present

## 2021-12-23 DIAGNOSIS — Z23 Encounter for immunization: Secondary | ICD-10-CM | POA: Diagnosis not present

## 2021-12-23 DIAGNOSIS — I10 Essential (primary) hypertension: Secondary | ICD-10-CM | POA: Diagnosis not present

## 2022-02-05 DIAGNOSIS — K219 Gastro-esophageal reflux disease without esophagitis: Secondary | ICD-10-CM | POA: Diagnosis not present

## 2022-02-05 DIAGNOSIS — I2510 Atherosclerotic heart disease of native coronary artery without angina pectoris: Secondary | ICD-10-CM | POA: Diagnosis not present

## 2022-02-05 DIAGNOSIS — I10 Essential (primary) hypertension: Secondary | ICD-10-CM | POA: Diagnosis not present

## 2022-02-05 DIAGNOSIS — E7800 Pure hypercholesterolemia, unspecified: Secondary | ICD-10-CM | POA: Diagnosis not present

## 2022-02-11 DIAGNOSIS — T161XXA Foreign body in right ear, initial encounter: Secondary | ICD-10-CM | POA: Diagnosis not present

## 2022-02-20 ENCOUNTER — Other Ambulatory Visit: Payer: Self-pay | Admitting: Interventional Cardiology

## 2022-02-22 ENCOUNTER — Ambulatory Visit: Payer: Medicare HMO | Admitting: Interventional Cardiology

## 2022-02-22 ENCOUNTER — Encounter: Payer: Self-pay | Admitting: Interventional Cardiology

## 2022-02-22 VITALS — BP 114/66 | HR 68 | Ht 67.0 in | Wt 170.4 lb

## 2022-02-22 DIAGNOSIS — I252 Old myocardial infarction: Secondary | ICD-10-CM

## 2022-02-22 DIAGNOSIS — I25118 Atherosclerotic heart disease of native coronary artery with other forms of angina pectoris: Secondary | ICD-10-CM

## 2022-02-22 DIAGNOSIS — I10 Essential (primary) hypertension: Secondary | ICD-10-CM | POA: Diagnosis not present

## 2022-02-22 DIAGNOSIS — E782 Mixed hyperlipidemia: Secondary | ICD-10-CM | POA: Diagnosis not present

## 2022-02-22 NOTE — Progress Notes (Signed)
Cardiology Office Note   Date:  02/22/2022   ID:  Damon Sanders, DOB [birth year], MRN [id number]  PCP:  Lajean Manes, MD    No chief complaint on file.  CAD  Wt Readings from Last 3 Encounters:  02/22/22 170 lb 6.4 oz (77.3 kg)  12/31/20 176 lb 9.6 oz (80.1 kg)  12/24/19 168 lb 6.4 oz (76.4 kg)       History of Present Illness: Damon Sanders is a 76 y.o. male    had an inferior STEMI treated at [facility] on 01/11/2019.  He received one stent to the mid RCA, 3.0 x 22 mm Onyx.  He had syncope a week before the MI.  A week later, he felt "funny" and was diaphoretic.  He had teeth pain.   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   He had his COVID vaccines.    Denies : Chest pain. Dizziness. Leg edema. Nitroglycerin use. Orthopnea. Palpitations. Paroxysmal nocturnal dyspnea. Shortness of breath. Syncope.    Not getting much cardiovascular exercise but he remains active.    Past Medical History:  Diagnosis Date   Hyperlipidemia    Hypertension     Past Surgical History:  Procedure Laterality Date   THYROIDECTOMY, PARTIAL  1999     Current Outpatient Medications  Medication Sig Dispense Refill   atorvastatin (LIPITOR) 80 MG tablet Take 1 tablet (80 mg total) by mouth daily. 90 tablet 3   carvedilol (COREG) 3.125 MG tablet TAKE 1 TABLET TWICE DAILY WITH MEAL 180 tablet 3   clopidogrel (PLAVIX) 75 MG tablet TAKE 1 TABLET EVERY DAY 90 tablet 0   dorzolamide-timolol (COSOPT) 22.3-6.8 MG/ML ophthalmic solution in the morning and at bedtime.     fish oil-omega-3 fatty acids 1000 MG capsule Take 2 g by mouth daily.     hydrochlorothiazide (MICROZIDE) 12.5 MG capsule Take 1 capsule (12.5 mg total) by mouth daily. 90 capsule 3   latanoprost (XALATAN) 0.005 % ophthalmic solution daily.     losartan (COZAAR) 100 MG tablet Take 100 mg by mouth daily.     nitroGLYCERIN (NITROSTAT) 0.4 MG SL  tablet Place 1 tablet (0.4 mg total) under the tongue every 5 (five) minutes as needed for chest pain. 90 tablet 3   No current facility-administered medications for this visit.    Allergies:   Caine-1 [lidocaine], Penicillins, Procaine, and Lisinopril    Social History:  The patient  reports that he has quit smoking. His smoking use included cigarettes. He has never used smokeless tobacco. He reports current alcohol use of about 8.0 standard drinks of alcohol per week. He reports that he does not use drugs.   Family History:  The patient's family history includes Colon cancer in his father.    ROS:  Please see the history of present illness.   Otherwise, review of systems are positive for finger stiffness.   All other systems are reviewed and negative.    PHYSICAL EXAM: VS:  BP 114/66   Pulse 68   Ht '5\' 7"'$  (1.702 m)   Wt 170 lb 6.4 oz (77.3 kg)   SpO2 96%   BMI 26.69 kg/m  , BMI Body mass index is 26.69 kg/m. GEN: Well nourished, well developed, in no acute distress HEENT: normal Neck: no JVD, carotid bruits, or masses Cardiac: RRR; no murmurs, rubs, or gallops,no edema  Respiratory:  clear to auscultation  bilaterally, normal work of breathing GI: soft, nontender, nondistended, + BS MS: no deformity or atrophy Skin: warm and dry, no rash Neuro:  Strength and sensation are intact Psych: euthymic mood, full affect   EKG:   The ekg ordered today demonstrates NSR, RBBB, LAFB   Recent Labs: No results found for requested labs within last 365 days.   Lipid Panel No results found for: "CHOL", "TRIG", "HDL", "CHOLHDL", "VLDL", "LDLCALC", "LDLDIRECT"   Other studies Reviewed: Additional studies/ records that were reviewed today with results demonstrating: labs reviewed.   ASSESSMENT AND PLAN:  CAD/old MI: No bleeding on clopidogrel monotherapy. No angina on medical therapy. S/p RCA PCI.  Continue aggressive secondary prevention. Hyperlipidemia: LDL 36 on atorvastatin 80  mg daily.  Whole food, plant based diet.   HTN: The current medical regimen is effective;  continue present plan and medications.  Low salt diet.     Current medicines are reviewed at length with the patient today.  The patient concerns regarding his medicines were addressed.  The following changes have been made:  No change  Labs/ tests ordered today include:  No orders of the defined types were placed in this encounter.   Recommend 150 minutes/week of aerobic exercise Low fat, low carb, high fiber diet recommended  Disposition:   FU in 1 year   Signed, Larae Grooms, MD  02/22/2022 4:33 PM    [facility] [address], [place], [place]  [address] Phone: [phone]; Fax: [phone]

## 2022-02-22 NOTE — Patient Instructions (Signed)

## 2022-03-10 DIAGNOSIS — M79641 Pain in right hand: Secondary | ICD-10-CM | POA: Diagnosis not present

## 2022-03-10 DIAGNOSIS — M79642 Pain in left hand: Secondary | ICD-10-CM | POA: Diagnosis not present

## 2022-03-30 DIAGNOSIS — M79641 Pain in right hand: Secondary | ICD-10-CM | POA: Diagnosis not present

## 2022-03-30 DIAGNOSIS — M65312 Trigger thumb, left thumb: Secondary | ICD-10-CM | POA: Diagnosis not present

## 2022-03-30 DIAGNOSIS — M79642 Pain in left hand: Secondary | ICD-10-CM | POA: Diagnosis not present

## 2022-05-06 DIAGNOSIS — G5602 Carpal tunnel syndrome, left upper limb: Secondary | ICD-10-CM | POA: Diagnosis not present

## 2022-05-07 ENCOUNTER — Other Ambulatory Visit: Payer: Self-pay | Admitting: Interventional Cardiology

## 2022-05-07 DIAGNOSIS — Z0100 Encounter for examination of eyes and vision without abnormal findings: Secondary | ICD-10-CM | POA: Diagnosis not present

## 2022-05-07 DIAGNOSIS — G43B0 Ophthalmoplegic migraine, not intractable: Secondary | ICD-10-CM | POA: Diagnosis not present

## 2022-05-07 DIAGNOSIS — H43813 Vitreous degeneration, bilateral: Secondary | ICD-10-CM | POA: Diagnosis not present

## 2022-05-07 DIAGNOSIS — Z961 Presence of intraocular lens: Secondary | ICD-10-CM | POA: Diagnosis not present

## 2022-05-07 DIAGNOSIS — H401122 Primary open-angle glaucoma, left eye, moderate stage: Secondary | ICD-10-CM | POA: Diagnosis not present

## 2022-05-20 DIAGNOSIS — M79641 Pain in right hand: Secondary | ICD-10-CM | POA: Diagnosis not present

## 2022-05-20 DIAGNOSIS — M79642 Pain in left hand: Secondary | ICD-10-CM | POA: Diagnosis not present

## 2022-05-27 DIAGNOSIS — G5612 Other lesions of median nerve, left upper limb: Secondary | ICD-10-CM | POA: Diagnosis not present

## 2022-05-27 DIAGNOSIS — G5601 Carpal tunnel syndrome, right upper limb: Secondary | ICD-10-CM | POA: Diagnosis not present

## 2022-05-27 DIAGNOSIS — G5621 Lesion of ulnar nerve, right upper limb: Secondary | ICD-10-CM | POA: Diagnosis not present

## 2022-06-03 DIAGNOSIS — G5621 Lesion of ulnar nerve, right upper limb: Secondary | ICD-10-CM | POA: Diagnosis not present

## 2022-06-03 DIAGNOSIS — G5603 Carpal tunnel syndrome, bilateral upper limbs: Secondary | ICD-10-CM | POA: Diagnosis not present

## 2022-06-08 ENCOUNTER — Telehealth: Payer: Self-pay | Admitting: Interventional Cardiology

## 2022-06-08 NOTE — Telephone Encounter (Signed)
STAT if patient feels like he/she is going to faint   Are you dizzy now? No, has felt lightheaded in the past.   Do you feel faint or have you passed out? No   Do you have any other symptoms? Patient states he had jaw pain last week. Also had jaw pain a little while ago.    Have you checked your HR and BP (record if available)? HR 78, BP 110/84

## 2022-06-08 NOTE — Progress Notes (Unsigned)
Office Visit    Patient Name: Damon Sanders Date of Encounter: 06/09/2022  Primary Care Provider:  Lajean Manes, MD Primary Cardiologist:  Larae Grooms, MD Primary Electrophysiologist: None  Chief Complaint    Damon Sanders is a 76 y.o. male with PMH of CAD s/p STEMI 12/2018 with DES to mid RCA, HLD, HTN who presents today for evaluation of dizziness and jaw pain similar to STEMI presentation.  Past Medical History    Past Medical History:  Diagnosis Date   Hyperlipidemia    Hypertension    Past Surgical History:  Procedure Laterality Date   THYROIDECTOMY, PARTIAL  1999    Allergies  Allergies  Allergen Reactions   Caine-1 [Lidocaine]     novacaine causing throat swelling   Penicillins     Swelling throat   Procaine Anaphylaxis and Other (See Comments)   Lisinopril Cough    History of Present Illness    Damon Sanders  is a 76 year old male with the above mention past medical history who presents today for complaint of dizziness and jaw pain.  Damon Sanders was treated for STEMI at [facility] on 01/11/2019.  He was noted to have nonobstructive CAD in other coronary arteries.  He underwent DES x1 to mid RCA.  2D echo was completed showing EF of 60-65% with normal LV size and systolic function.  He was treated with Brilinta and ASA for 1 year.  He was initially seen by Dr. Irish Lack for surgical clearance of periodontal procedure.  He has been followed for subsequent visits for follow-up of his coronary artery disease.  He was last seen by Dr. Irish Lack on 12/2020 for follow-up.  During visit patient had no complaints of chest pain and was euvolemic on examination.  His blood pressure was currently controlled and no medication changes were made.   Damon Sanders presents today for follow-up of dizziness and jaw pain.  Since last being seen in the office patient reports that he has been feeling poorly and has endorsed shortness of breath, light  headedness.  His blood pressure today was well controlled at 124/62 and he is compliant with his current medication regimen.  EKG was obtained and patient was found to be in a variable flutter with rate of 129 bpm.  We discussed the pathophysiology of atrial flutter and the importance of anticoagulation.  Patient expressed full understanding of current treatment plan and reason for additional medications for management.  Case was discussed further with his primary cardiologist and Dr. Caryl Comes who was DOD today and both recommended proceeding with DCCV/TEE.  Patient denies chest pain, palpitations, dyspnea, PND, orthopnea, nausea, vomiting, dizziness, syncope, edema, weight gain, or early satiety.  Home Medications    Current Outpatient Medications  Medication Sig Dispense Refill   apixaban (ELIQUIS) 5 MG TABS tablet Take 1 tablet (5 mg total) by mouth 2 (two) times daily. 180 tablet 3   atorvastatin (LIPITOR) 80 MG tablet Take 1 tablet (80 mg total) by mouth daily. 90 tablet 3   carvedilol (COREG) 6.25 MG tablet Take 1 tablet (6.25 mg total) by mouth 2 (two) times daily. 180 tablet 3   clopidogrel (PLAVIX) 75 MG tablet TAKE 1 TABLET EVERY DAY 90 tablet 3   dorzolamide-timolol (COSOPT) 22.3-6.8 MG/ML ophthalmic solution Place 1 drop into both eyes in the morning and at bedtime.     fish oil-omega-3 fatty acids 1000 MG capsule Take 1 g by mouth daily.  latanoprost (XALATAN) 0.005 % ophthalmic solution Place 1 drop into both eyes at bedtime.     losartan (COZAAR) 100 MG tablet Take 100 mg by mouth daily.     diclofenac Sodium (VOLTAREN ARTHRITIS PAIN) 1 % GEL Apply 2 g topically daily as needed (pain).     hydrochlorothiazide (MICROZIDE) 12.5 MG capsule Take 1 capsule (12.5 mg total) by mouth daily. 90 capsule 3   nitroGLYCERIN (NITROSTAT) 0.4 MG SL tablet Place 1 tablet (0.4 mg total) under the tongue every 5 (five) minutes as needed for chest pain. 90 tablet 3   No current facility-administered  medications for this visit.     Review of Systems  Please see the history of present illness.    (+) Jaw pain (+) Lightheadedness, shortness of breath  All other systems reviewed and are otherwise negative except as noted above.  Physical Exam    Wt Readings from Last 3 Encounters:  06/09/22 170 lb (77.1 kg)  02/22/22 170 lb 6.4 oz (77.3 kg)  12/31/20 176 lb 9.6 oz (80.1 kg)   VS: Vitals:   06/09/22 0858  BP: 124/62  Pulse: (!) 129  SpO2: 97%  ,Body mass index is 26.63 kg/m.  Constitutional:      Appearance: Healthy appearance. Not in distress.  Neck:     Vascular: JVD normal.  Pulmonary:     Effort: Pulmonary effort is normal.     Breath sounds: No wheezing. No rales. Diminished in the bases Cardiovascular:     Irregularly irregular normal S1. Normal S2.      Murmurs: There is no murmur.  Edema:    Peripheral edema absent.  Abdominal:     Palpations: Abdomen is soft non tender. There is no hepatomegaly.  Skin:    General: Skin is warm and dry.  Neurological:     General: No focal deficit present.     Mental Status: Alert and oriented to person, place and time.     Cranial Nerves: Cranial nerves are intact.  EKG/LABS/Other Studies Reviewed    ECG personally reviewed by me today -variable flutter with RBBB and left anterior fascicular block with rate of 129 bpm with no acute changes noted.  Risk Assessment/Calculations:    CHA2DS2-VASc Score = 3   This indicates a 3.2% annual risk of stroke. The patient's score is based upon: CHF History: 0 HTN History: 1 Diabetes History: 0 Stroke History: 0 Vascular Disease History: 1 Age Score: 1 Gender Score: 0      Lab Results  Component Value Date   WBC 6.6 03/15/2007   HGB 13.8 03/15/2007   HCT 39.8 03/15/2007   MCV 89.3 03/15/2007   PLT 193 03/15/2007   Lab Results  Component Value Date   CREATININE 0.90 03/15/2007   BUN 8 03/15/2007   NA 142 03/15/2007   K 4.0 03/15/2007   CL 105 03/15/2007    CO2 30 03/15/2007   No results found for: "ALT", "AST", "GGT", "ALKPHOS", "BILITOT" No results found for: "CHOL", "HDL", "LDLCALC", "LDLDIRECT", "TRIG", "CHOLHDL"  No results found for: "HGBA1C"  Assessment & Plan    1.  Coronary artery disease: -s/p STEMI treated with DES x1 to RCA in 12/2018 -Today patient presents with complaint of jaw pain similar to STEMI presentation and dizziness. -EKG was completed showing new onset variable flutter -Continue current GDMT with Plavix 75 mg, carvedilol 3.125 mg twice daily, Lipitor 80 mg daily and Nitrostat 0.4 mg as needed  2.  Essential hypertension: -Patient's blood  pressure today was well controlled at 124/62 -Continue Microzide 12.5 mg  and losartan 100 mg -Renal will be increased to 6.25 mg twice daily for rate control.  3.  Hyperlipidemia: -Continue atorvastatin 80 mg daily  4.  New onset atrial flutter: -Patient presents with lightheadedness and shortness of breath and found to be in variable atrial flutter with a rate of 129 bpm. -CHA2DS2-VASc Score = 3 [CHF History: 0, HTN History: 1, Diabetes History: 0, Stroke History: 0, Vascular Disease History: 1, Age Score: 1, Gender Score: 0].  Therefore, the patient's annual risk of stroke is 3.2 %.     -We will start patient on Eliquis 5 mg twice daily -Case discussed with DOD Dr. Caryl Comes who recommends patient undergo TEE/DCCV based on current symptomatic state. -CBC and BMET today -Increase  carvedilol to 6.25 mg twice daily for rate control. -Patient will undergo 2D echo once in sinus rhythm further evaluate heart valves and structure.  Disposition on: Follow-up with Larae Grooms, MD or APP in 1 months Shared Decision Making/Informed Consent The risks [stroke, cardiac arrhythmias rarely resulting in the need for a temporary or permanent pacemaker, skin irritation or burns, esophageal damage, perforation (1:10,000 risk), bleeding, pharyngeal hematoma as well as other potential  complications associated with conscious sedation including aspiration, arrhythmia, respiratory failure and death], benefits (treatment guidance, restoration of normal sinus rhythm, diagnostic support) and alternatives of a transesophageal echocardiogram guided cardioversion were discussed in detail with Damon Sanders and he is willing to proceed.   Medication Adjustments/Labs and Tests Ordered: Current medicines are reviewed at length with the patient today.  Concerns regarding medicines are outlined above.   Signed, Mable Fill, Marissa Nestle, NP 06/09/2022, 12:07 PM [facility]  Note:  This document was prepared using Dragon voice recognition software and may include unintentional dictation errors.

## 2022-06-08 NOTE — Telephone Encounter (Signed)
Pt called back regarding information in note below.   Note: Pt has PMH of STEMI; Pt had jaw pain with STEMI when diagnosed.    Pt called and stated he had jaw pain last night after eating dinner.  Pt went to bed, and jaw pain went away, but going from laying / sitting / standing, felt mildly dizzy.    Pt also stated the past few weeks has felt fatigued, and has felt exhausted with ambulation.   Pt asked if symptomatic at time of call, Pt stated NO.   Pt was concerned about Jaw pain, as this was how his heart attack symptoms occurred.    With Pt PMH, I scheduled him for EKG / Office visit with Mr. Ambrose Pancoast, NP.  Pt has intermittent symptoms for a few weeks.  Pt educated on the ER, and advised to go there if symptom comes back today or tonight.  Pt understood;  Appointment 06/09/2022.

## 2022-06-09 ENCOUNTER — Ambulatory Visit: Payer: Medicare HMO | Attending: Nurse Practitioner | Admitting: Nurse Practitioner

## 2022-06-09 ENCOUNTER — Encounter: Payer: Self-pay | Admitting: Nurse Practitioner

## 2022-06-09 VITALS — BP 124/62 | HR 129 | Ht 67.0 in | Wt 170.0 lb

## 2022-06-09 DIAGNOSIS — I4891 Unspecified atrial fibrillation: Secondary | ICD-10-CM | POA: Diagnosis not present

## 2022-06-09 DIAGNOSIS — I25118 Atherosclerotic heart disease of native coronary artery with other forms of angina pectoris: Secondary | ICD-10-CM

## 2022-06-09 MED ORDER — APIXABAN 5 MG PO TABS: 5.0000 mg | ORAL_TABLET | Freq: Two times a day (BID) | ORAL | 3 refills | Status: DC

## 2022-06-09 MED ORDER — CARVEDILOL 6.25 MG PO TABS: 6.2500 mg | ORAL_TABLET | Freq: Two times a day (BID) | ORAL | 3 refills | Status: DC

## 2022-06-09 NOTE — Patient Instructions (Signed)
Medication Instructions:  Your physician has recommended you make the following change in your medication:   Start Eliquis 5 mg  *If you need a refill on your cardiac medications before your next appointment, please call your pharmacy*  2. Increase carvedilol to 6.'25mg'$  2 times daily  Lab Work: Bmet, CBC  If you have labs (blood work) drawn today and your tests are completely normal, you will receive your results only by: [employer] (if you have MyChart) OR A paper copy in the mail If you have any lab test that is abnormal or we need to change your treatment, we will call you to review the results.   Testing/Procedures: Your physician has recommended that you have a Cardioversion (DCCV). Electrical Cardioversion uses a jolt of electricity to your heart either through paddles or wired patches attached to your chest. This is a controlled, usually prescheduled, procedure. Defibrillation is done under light anesthesia in the hospital, and you usually go home the day of the procedure. This is done to get your heart back into a normal rhythm. You are not awake for the procedure. Please see the instruction sheet given to you today.    Follow-Up: At [facility], you and your health needs are our priority.  As part of our continuing mission to provide you with exceptional heart care, we have created designated Provider Care Teams.  These Care Teams include your primary Cardiologist (physician) and Advanced Practice Providers (APPs -  Physician Assistants and Nurse Practitioners) who all work together to provide you with the care you need, when you need it.  We recommend signing up for the patient portal called "MyChart".  Sign up information is provided on this After Visit Summary.  MyChart is used to connect with patients for Virtual Visits (Telemedicine).  Patients are able to view lab/test results, encounter notes, upcoming appointments, etc.  Non-urgent messages can be sent to your  provider as well.   To learn more about what you can do with MyChart, go to [email or web address].    Your next appointment:   1 month(s)  The format for your next appointment:   In Person  Provider:   Ambrose Pancoast   Other Instructions  You are scheduled for a TEE/Cardioversion/TEE Cardioversion on 06/11/22 with Dr. Harrington Challenger.  Please arrive at the [facility] (Main Entrance A) at [facility]: [address] [place], [place] [address] at 09:30 am/pm. (1 hour prior to procedure unless lab work is needed; if lab work is needed arrive 1.5 hours ahead)  DIET: Nothing to eat or drink after midnight except a sip of water with medications (see medication instructions below)  FYI: For your safety, and to allow [place] to monitor your vital signs accurately during the surgery/procedure we request that   if you have artificial nails, gel coating, SNS etc. Please have those removed prior to your surgery/procedure. Not having the nail coverings /polish removed may result in cancellation or delay of your surgery/procedure.   Medication Instructions:  Continue your anticoagulant: Eliquis You will need to continue your anticoagulant after your procedure until you  are told by your  Provider that it is safe to stop   Labs: CBC and BMET today  You must have a responsible person to drive you home and stay in the waiting area during your procedure. Failure to do so could result in cancellation.  Bring your insurance cards.  *Special Note: Every effort is made to have your procedure done on time. Occasionally there  are emergencies that occur at the hospital that may cause delays. Please be patient if a delay does occur.

## 2022-06-10 LAB — CBC
Hematocrit: 40.8 % (ref 37.5–51.0)
Hemoglobin: 13.8 g/dL (ref 13.0–17.7)
MCH: 31.7 pg (ref 26.6–33.0)
MCHC: 33.8 g/dL (ref 31.5–35.7)
MCV: 94 fL (ref 79–97)
Platelets: 243 10*3/uL (ref 150–450)
RBC: 4.35 x10E6/uL (ref 4.14–5.80)
RDW: 14.3 % (ref 11.6–15.4)
WBC: 12.3 10*3/uL — ABNORMAL HIGH (ref 3.4–10.8)

## 2022-06-10 LAB — BASIC METABOLIC PANEL
BUN/Creatinine Ratio: 17 (ref 10–24)
BUN: 20 mg/dL (ref 8–27)
CO2: 25 mmol/L (ref 20–29)
Calcium: 9.7 mg/dL (ref 8.6–10.2)
Chloride: 103 mmol/L (ref 96–106)
Creatinine, Ser: 1.16 mg/dL (ref 0.76–1.27)
Glucose: 104 mg/dL — ABNORMAL HIGH (ref 70–99)
Potassium: 3.7 mmol/L (ref 3.5–5.2)
Sodium: 143 mmol/L (ref 134–144)
eGFR: 66 mL/min/{1.73_m2} (ref >59)

## 2022-06-11 ENCOUNTER — Ambulatory Visit (HOSPITAL_COMMUNITY)
Admission: RE | Admit: 2022-06-11 | Discharge: 2022-06-11 | Disposition: A | Discharge status: Home / Self Care | Payer: Medicare HMO | Source: Ambulatory Visit | Attending: Internal Medicine | Admitting: Internal Medicine

## 2022-06-11 ENCOUNTER — Encounter (HOSPITAL_COMMUNITY)
Admission: RE | Disposition: A | Discharge status: Home / Self Care | Payer: Self-pay | Source: Ambulatory Visit | Attending: Internal Medicine

## 2022-06-11 ENCOUNTER — Ambulatory Visit (HOSPITAL_COMMUNITY): Admission: RE | Admit: 2022-06-11 | Payer: Medicare HMO | Source: Ambulatory Visit | Admitting: Internal Medicine

## 2022-06-11 ENCOUNTER — Ambulatory Visit (HOSPITAL_COMMUNITY): Payer: Medicare HMO | Admitting: General Practice

## 2022-06-11 DIAGNOSIS — I4891 Unspecified atrial fibrillation: Secondary | ICD-10-CM | POA: Insufficient documentation

## 2022-06-11 DIAGNOSIS — E222 Syndrome of inappropriate secretion of antidiuretic hormone: Secondary | ICD-10-CM

## 2022-06-11 DIAGNOSIS — Z538 Procedure and treatment not carried out for other reasons: Secondary | ICD-10-CM | POA: Insufficient documentation

## 2022-06-11 SURGERY — CANCELLED PROCEDURE

## 2022-06-11 NOTE — Anesthesia Preprocedure Evaluation (Signed)
Anesthesia Evaluation  Patient identified by MRN, date of birth, ID band Patient awake    Reviewed: Allergy & Precautions, H&P , NPO status , Patient's Chart, lab work & pertinent test results  Airway Mallampati: II   Neck ROM: full    Dental   Pulmonary former smoker,    breath sounds clear to auscultation       Cardiovascular hypertension, + dysrhythmias Atrial Fibrillation  Rhythm:irregular Rate:Normal     Neuro/Psych    GI/Hepatic   Endo/Other    Renal/GU      Musculoskeletal   Abdominal   Peds  Hematology   Anesthesia Other Findings   Reproductive/Obstetrics                             Anesthesia Physical Anesthesia Plan  ASA: 3  Anesthesia Plan: MAC   Post-op Pain Management:    Induction: Intravenous  PONV Risk Score and Plan: 1 and Propofol infusion and Treatment may vary due to age or medical condition  Airway Management Planned: Nasal Cannula  Additional Equipment:   Intra-op Plan:   Post-operative Plan:   Informed Consent: I have reviewed the patients History and Physical, chart, labs and discussed the procedure including the risks, benefits and alternatives for the proposed anesthesia with the patient or authorized representative who has indicated his/her understanding and acceptance.     Dental advisory given  Plan Discussed with: CRNA, Anesthesiologist and Surgeon  Anesthesia Plan Comments:         Anesthesia Quick Evaluation

## 2022-06-11 NOTE — Progress Notes (Signed)
Patient presented for TEE/CV   He was in [place] physiology with him REcomm he keep on anticoagulant He said he started feeling better yesterday. Will let E. Barbarann Ehlers and [employer] know      Keep on Eliquis Keep appt at end November Call if develops symptoms again (SOB, chest pressure)    Note patient has an apple watch   Reviewed use.     Dorris Carnes MD

## 2022-06-11 NOTE — Progress Notes (Signed)
Patient here for TEE CV. Stated his heart rate was lower this am and he didn't think he was in A fib now. EKG performed, sinus rhythm, Dr Harrington Challenger notified.

## 2022-06-16 ENCOUNTER — Telehealth: Payer: Self-pay | Admitting: Internal Medicine

## 2022-06-16 NOTE — Telephone Encounter (Signed)
Patient is wanting to know if his appt should be moved up or pushed back due to TEE being cancelled since it was not needed. He would also like to discuss how long he would have to stay on the blood thinners. Please advise.

## 2022-06-16 NOTE — Telephone Encounter (Signed)
See note below from Dr. Harrington Challenger. Advised patient to keep appointment as scheduled.      Signed                            Patient presented for TEE/CV   He was in [place] physiology with him REcomm he keep on anticoagulant He said he started feeling better yesterday. Will let E. Barbarann Ehlers and [employer] know       Keep on Eliquis Keep appt at end November Call if develops symptoms again (SOB, chest pressure)    Note patient has an apple watch   Reviewed use.      Dorris Carnes MD

## 2022-06-17 ENCOUNTER — Telehealth: Payer: Self-pay | Admitting: *Deleted

## 2022-06-17 DIAGNOSIS — I4891 Unspecified atrial fibrillation: Secondary | ICD-10-CM

## 2022-06-17 NOTE — Telephone Encounter (Signed)
Jettie Booze, MD  Marylu Lund., NP; Dewberry Grayer, RN Would have him f/u with EP to consider flutter ablation       Previous Messages    ----- Message ----- From: Fay Records, MD Sent: 06/11/2022  10:35 AM EDT To: Jettie Booze, MD; Marylu Lund., NP Subject: Inpatient Notes                                FYI-  See note   PT converted on own     Feeling better    Will call for symtpoms    Has appt at end of November

## 2022-06-17 NOTE — Telephone Encounter (Signed)
Patient notified.  He would like to proceed with EP referral.  Referral placed.  Patient aware to keep follow up with Ambrose Pancoast, NP later this month.

## 2022-07-12 NOTE — Progress Notes (Signed)
Office Visit    Patient Name: Damon Sanders Date of Encounter: 07/12/2022  Primary Care Provider:  Kathalene Frames, MD Primary Cardiologist:  Larae Grooms, MD Primary Electrophysiologist: None  Chief Complaint    Damon Sanders is a 76 y.o. male with PMH of CAD s/p STEMI 12/2018 with DES to mid RCA, HLD, HTN who presents today for follow-up of new onset atrial flutter.  Past Medical History    Past Medical History:  Diagnosis Date   Hyperlipidemia    Hypertension    Past Surgical History:  Procedure Laterality Date   THYROIDECTOMY, PARTIAL  1999    Allergies  Allergies  Allergen Reactions   Caine-1 [Lidocaine]     novacaine causing throat swelling   Penicillins     Swelling throat   Procaine Anaphylaxis and Other (See Comments)   Lisinopril Cough    History of Present Illness    Damon Sanders  is a 76 year old male with the above mention past medical history who presents today for complaint of dizziness and jaw pain.  Damon Sanders was treated for STEMI at [facility] on 01/11/2019.  He was noted to have nonobstructive CAD in other coronary arteries.  He underwent DES x1 to mid RCA.  2D echo was completed showing EF of 60-65% with normal LV size and systolic function.  He was treated with Brilinta and ASA for 1 year.  He was initially seen by Dr. Irish Lack for surgical clearance of periodontal procedure.  He has been followed for subsequent visits for follow-up of his coronary artery disease.  He was last seen by Dr. Irish Lack on 12/2020 for follow-up.  During visit patient had no complaints of chest pain and was euvolemic on examination.  His blood pressure was currently controlled and no medication changes were made.  During patient's 06/09/2022 visit he presented with dizziness and jaw pain.  He also endorsed shortness of breath and lightheadedness.  EKG was obtained and showed patient in variable flutter with rate of 129 bpm.  His  blood pressure was 124/62.  He was started on Eliquis 5 mg twice daily and was recommended to undergo TEE/DCCV.  He presented for cardioversion 06/11/2022 but was in sinus rhythm at that time.  Patient requested referral to EP and will have completed in December.  Damon Sanders presents today for 1 month follow-up alone.  He has since last being seen in the office patient reports that he has been doing well with no recurrence of flutter or fib.  He is tolerating his carvedilol and reports no adverse reactions.  He is currently on Eliquis and has reported some muscle pain and itchy red bumps on his shoulders and upper back.  His blood pressure today was well-controlled at 132/70 and heart rate was 67 bpm.  During our visit I was able to discuss with Dr. Irish Lack his primary cardiologist regarding switching to Xarelto.  Through a shared decision patient was in favor of switching at this time.  We also discussed his upcoming appointment with electrophysiology.  We reviewed the pathophysiology of atrial fibs/flutter and all questions were answered by patient to his satisfaction.  Patient denies chest pain, palpitations, dyspnea, PND, orthopnea, nausea, vomiting, dizziness, syncope, edema, weight gain, or early satiety.  Home Medications    Current Outpatient Medications  Medication Sig Dispense Refill   apixaban (ELIQUIS) 5 MG TABS tablet Take 1 tablet (5 mg total) by mouth 2 (two) times daily. 180 tablet  3   atorvastatin (LIPITOR) 80 MG tablet Take 1 tablet (80 mg total) by mouth daily. 90 tablet 3   carvedilol (COREG) 6.25 MG tablet Take 1 tablet (6.25 mg total) by mouth 2 (two) times daily. 180 tablet 3   clopidogrel (PLAVIX) 75 MG tablet TAKE 1 TABLET EVERY DAY 90 tablet 3   diclofenac Sodium (VOLTAREN ARTHRITIS PAIN) 1 % GEL Apply 2 g topically daily as needed (pain).     dorzolamide-timolol (COSOPT) 22.3-6.8 MG/ML ophthalmic solution Place 1 drop into both eyes in the morning and at bedtime.     fish  oil-omega-3 fatty acids 1000 MG capsule Take 1 g by mouth daily.     hydrochlorothiazide (MICROZIDE) 12.5 MG capsule Take 1 capsule (12.5 mg total) by mouth daily. 90 capsule 3   latanoprost (XALATAN) 0.005 % ophthalmic solution Place 1 drop into both eyes at bedtime.     losartan (COZAAR) 100 MG tablet Take 100 mg by mouth daily.     nitroGLYCERIN (NITROSTAT) 0.4 MG SL tablet Place 1 tablet (0.4 mg total) under the tongue every 5 (five) minutes as needed for chest pain. 90 tablet 3   No current facility-administered medications for this visit.     Review of Systems  Please see the history of present illness.    (+) Chronic hand pain (+) Rash on bilateral shoulders  All other systems reviewed and are otherwise negative except as noted above.  Physical Exam    Wt Readings from Last 3 Encounters:  06/09/22 170 lb (77.1 kg)  02/22/22 170 lb 6.4 oz (77.3 kg)  12/31/20 176 lb 9.6 oz (80.1 kg)   JT:TSVXB were no vitals filed for this visit.,There is no height or weight on file to calculate BMI.  Constitutional:      Appearance: Healthy appearance. Not in distress.  Neck:     Vascular: JVD normal.  Pulmonary:     Effort: Pulmonary effort is normal.     Breath sounds: No wheezing. No rales. Diminished in the bases Cardiovascular:     Normal rate. Regular rhythm. Normal S1. Normal S2.      Murmurs: There is no murmur.  Edema:    Peripheral edema absent.  Abdominal:     Palpations: Abdomen is soft non tender. There is no hepatomegaly.  Skin:    General: Skin is warm and dry.  Neurological:     General: No focal deficit present.     Mental Status: Alert and oriented to person, place and time.     Cranial Nerves: Cranial nerves are intact.  EKG/LABS/Other Studies Reviewed    ECG personally reviewed by me today -none completed today  Risk Assessment/Calculations:    CHA2DS2-VASc Score = 3   This indicates a 3.2% annual risk of stroke. The patient's score is based upon: CHF  History: 0 HTN History: 1 Diabetes History: 0 Stroke History: 0 Vascular Disease History: 1 Age Score: 1 Gender Score: 0       Lab Results  Component Value Date   WBC 12.3 (H) 06/09/2022   HGB 13.8 06/09/2022   HCT 40.8 06/09/2022   MCV 94 06/09/2022   PLT 243 06/09/2022   Lab Results  Component Value Date   CREATININE 1.16 06/09/2022   BUN 20 06/09/2022   NA 143 06/09/2022   K 3.7 06/09/2022   CL 103 06/09/2022   CO2 25 06/09/2022   No results found for: "ALT", "AST", "GGT", "ALKPHOS", "BILITOT" No results found for: "CHOL", "HDL", "LDLCALC", "  LDLDIRECT", "TRIG", "CHOLHDL"  No results found for: "HGBA1C"  Assessment & Plan   1.  Coronary artery disease: -s/p STEMI treated with DES x1 to RCA in 12/2018 -Today patient presents with complaint of jaw pain similar to STEMI presentation and dizziness. -EKG was completed showing new onset variable flutter -Continue current GDMT with Plavix 75 mg, carvedilol 6.25 mg twice daily, Lipitor 80 mg daily and Nitrostat 0.4 mg as needed   2.  Essential hypertension: -Patient's blood pressure today was well controlled at 132/70 -Continue Microzide 12.5 mg  and losartan 100 mg  3.  Hyperlipidemia: -Last LDL was 43 -Continue atorvastatin 80 mg daily   4.  Atrial flutter: -Patient presented for DCCV 1 month ago but had converted to normal sinus rhythm and procedure was canceled. -Today patient is rate sinus rhythm with rate control less than 70 bpm. -CHA2DS2-VASc Score = 3 [CHF History: 0, HTN History: 1, Diabetes History: 0, Stroke History: 0, Vascular Disease History: 1, Age Score: 1, Gender Score: 0].  Therefore, the patient's annual risk of stroke is 3.2 %.     -He reports increased muscle aches and rash on his shoulders with Eliquis. -We will discontinue Eliquis 5 mg twice daily and initiate Xarelto 20 mg daily -Current dose appropriate with creatinine clearance of 61 mL/minute -Continue carvedilol 6.25 mg twice daily for  rate control -Patient has referral with electrophysiology later this week.   Disposition: Follow-up with Larae Grooms, MD or APP in 3 months   Medication Adjustments/Labs and Tests Ordered: Current medicines are reviewed at length with the patient today.  Concerns regarding medicines are outlined above.   Signed, Damon Sanders, Marissa Nestle, NP 07/12/2022, 7:09 PM [facility]  Note:  This document was prepared using Dragon voice recognition software and may include unintentional dictation errors.

## 2022-07-13 ENCOUNTER — Encounter: Payer: Self-pay | Admitting: Nurse Practitioner

## 2022-07-13 ENCOUNTER — Ambulatory Visit: Payer: Medicare HMO | Attending: Nurse Practitioner | Admitting: Nurse Practitioner

## 2022-07-13 VITALS — BP 132/70 | HR 67 | Ht 67.0 in | Wt 171.6 lb

## 2022-07-13 DIAGNOSIS — I2510 Atherosclerotic heart disease of native coronary artery without angina pectoris: Secondary | ICD-10-CM | POA: Diagnosis not present

## 2022-07-13 DIAGNOSIS — E785 Hyperlipidemia, unspecified: Secondary | ICD-10-CM

## 2022-07-13 DIAGNOSIS — I4892 Unspecified atrial flutter: Secondary | ICD-10-CM | POA: Diagnosis not present

## 2022-07-13 DIAGNOSIS — I10 Essential (primary) hypertension: Secondary | ICD-10-CM

## 2022-07-13 MED ORDER — CARVEDILOL 6.25 MG PO TABS: 6.2500 mg | ORAL_TABLET | Freq: Two times a day (BID) | ORAL | 1 refills | Status: DC

## 2022-07-13 MED ORDER — RIVAROXABAN 20 MG PO TABS: 20.0000 mg | ORAL_TABLET | Freq: Every day | ORAL | 3 refills | Status: DC

## 2022-07-13 NOTE — Patient Instructions (Signed)
Medication Instructions:  DISCONTINUE Eliquis AND DO NOT TAKE ANY MORE ELIQUIS  START Xarelto '20mg'$  Take one tablet daily-TAKE YOUR FIRST DOSE TONIGHT WITH DINNER *If you need a refill on your cardiac medications before your next appointment, please call your pharmacy*  Lab Work: None ordered  Testing/Procedures: None ordered  Follow-Up: At [facility], you and your health needs are our priority.  As part of our continuing mission to provide you with exceptional heart care, we have created designated Provider Care Teams.  These Care Teams include your primary Cardiologist (physician) and Advanced Practice Providers (APPs -  Physician Assistants and Nurse Practitioners) who all work together to provide you with the care you need, when you need it.  We recommend signing up for the patient portal called "MyChart".  Sign up information is provided on this After Visit Summary.  MyChart is used to connect with patients for Virtual Visits (Telemedicine).  Patients are able to view lab/test results, encounter notes, upcoming appointments, etc.  Non-urgent messages can be sent to your provider as well.   To learn more about what you can do with MyChart, go to [email or web address].    Your next appointment:   3 month(s)  The format for your next appointment:   In Person  Provider:   Ambrose Pancoast, NP      Other Instructions  Important Information About Sugar

## 2022-07-16 ENCOUNTER — Ambulatory Visit: Payer: Medicare HMO | Attending: Internal Medicine | Admitting: Internal Medicine

## 2022-07-16 ENCOUNTER — Encounter: Payer: Self-pay | Admitting: Internal Medicine

## 2022-07-16 VITALS — BP 122/60 | HR 65 | Ht 67.0 in | Wt 168.0 lb

## 2022-07-16 DIAGNOSIS — I4892 Unspecified atrial flutter: Secondary | ICD-10-CM | POA: Insufficient documentation

## 2022-07-16 NOTE — Progress Notes (Signed)
HPI [name] is referred for evaluation of atrial flutter. He is a pleasant 76 yo man with CAD, dyslipidemia, and s/p CABG who developed typical atrial flutter which stopped spontaneously. He has been anti-coagulated as his CHADSVASC score is 3 and soon to be 4. He did not feel palpitations but noted that something was wrong when he was walking up a steep hill. He had to stop, not his usual. He was found to be in typical atrial flutter. Allergies  Allergen Reactions   Caine-1 [Lidocaine]     novacaine causing throat swelling   Penicillins     Swelling throat   Procaine Anaphylaxis and Other (See Comments)   Lisinopril Cough     Current Outpatient Medications  Medication Sig Dispense Refill   atorvastatin (LIPITOR) 80 MG tablet Take 1 tablet (80 mg total) by mouth daily. 90 tablet 3   carvedilol (COREG) 6.25 MG tablet Take 1 tablet (6.25 mg total) by mouth 2 (two) times daily. 180 tablet 1   clopidogrel (PLAVIX) 75 MG tablet TAKE 1 TABLET EVERY DAY 90 tablet 3   diclofenac Sodium (VOLTAREN ARTHRITIS PAIN) 1 % GEL Apply 2 g topically daily as needed (pain).     dorzolamide-timolol (COSOPT) 22.3-6.8 MG/ML ophthalmic solution Place 1 drop into both eyes in the morning and at bedtime.     fish oil-omega-3 fatty acids 1000 MG capsule Take 1 g by mouth daily.     hydrochlorothiazide (MICROZIDE) 12.5 MG capsule Take 1 capsule (12.5 mg total) by mouth daily. 90 capsule 3   latanoprost (XALATAN) 0.005 % ophthalmic solution Place 1 drop into both eyes at bedtime.     losartan (COZAAR) 100 MG tablet Take 100 mg by mouth daily.     nitroGLYCERIN (NITROSTAT) 0.4 MG SL tablet Place 1 tablet (0.4 mg total) under the tongue every 5 (five) minutes as needed for chest pain. 90 tablet 3   rivaroxaban (XARELTO) 20 MG TABS tablet Take 1 tablet (20 mg total) by mouth daily with supper. 30 tablet 3   No current facility-administered medications for this visit.     Past Medical History:   Diagnosis Date   Hyperlipidemia    Hypertension     ROS:   All systems reviewed and negative except as noted in the HPI.   Past Surgical History:  Procedure Laterality Date   THYROIDECTOMY, PARTIAL  1999     Family History  Problem Relation Age of Onset   Colon cancer Father    Esophageal cancer Neg Hx    Rectal cancer Neg Hx    Stomach cancer Neg Hx      Social History   Socioeconomic History   Marital status: Married    Spouse name: Not on file   Number of children: Not on file   Years of education: Not on file   Highest education level: Not on file  Occupational History   Not on file  Tobacco Use   Smoking status: Former    Years: 10.00    Types: Cigarettes   Smokeless tobacco: Never  Substance and Sexual Activity   Alcohol use: Yes    Alcohol/week: 8.0 standard drinks of alcohol    Types: 8 Cans of beer per week   Drug use: No   Sexual activity: Not on file  Other Topics Concern   Not on file  Social History Narrative   Not on file   Social Determinants of Health   Financial Resource Strain: Not  on file  Food Insecurity: Not on file  Transportation Needs: Not on file  Physical Activity: Not on file  Stress: Not on file  Social Connections: Not on file  Intimate Partner Violence: Not on file     BP 122/60   Pulse 65   Ht '5\' 7"'$  (1.702 m)   Wt 168 lb (76.2 kg)   SpO2 98%   BMI 26.31 kg/m   Physical Exam:  Well appearing NAD HEENT: Unremarkable Neck:  No JVD, no thyromegally Lymphatics:  No adenopathy Back:  No CVA tenderness Lungs:  Clear with no wheezes HEART:  Regular rate rhythm, no murmurs, no rubs, no clicks Abd:  soft, positive bowel sounds, no organomegally, no rebound, no guarding Ext:  2 plus pulses, no edema, no cyanosis, no clubbing Skin:  No rashes no nodules Neuro:  CN II through XII intact, motor grossly intact  EKG - NSR  Assess/Plan: Atrial flutter - I have discussed the treatment options with the patient and  recommended EP study and catheter ablation vs a period of watchful waiting. The pro's and con's of both approaches were revewed and he will call us if he wishes to proceed. CAD - he did not have anginal symptoms when he was in atrial flutter. Coags -he has not had any bleeding on xarelto. HTN -his bp is controlled.  Damon Sanders Damon Barnwell,MD

## 2022-07-16 NOTE — Patient Instructions (Addendum)
Medication Instructions:  Your physician recommends that you continue on your current medications as directed. Please refer to the Current Medication list given to you today.  *If you need a refill on your cardiac medications before your next appointment, please call your pharmacy*  Lab Work: None ordered.  If you have labs (blood work) drawn today and your tests are completely normal, you will receive your results only by: [place] (if you have MyChart) OR A paper copy in the mail If you have any lab test that is abnormal or we need to change your treatment, we will call you to review the results.  Testing/Procedures: None ordered.  Follow-Up: Dates for Atrial Flutter Ablation in January with Dr. Cristopher Peru  January: 8, 15, 22, 29, 31.     Please contact us with the date you select, and call us at [phone].    Cardiac Ablation Cardiac ablation is a procedure to destroy, or ablate, a small amount of heart tissue that is causing problems. The heart has many electrical connections. Sometimes, these connections are abnormal and can cause the heart to beat very fast or irregularly. Ablating the abnormal areas can improve the heart's rhythm or return it to normal. Ablation may be done for people who: Have irregular or rapid heartbeats (arrhythmias). Have Wolff-Parkinson-White syndrome. Have taken medicines for an arrhythmia that did not work or caused side effects. Have a high-risk heartbeat that may be life-threatening. Tell a health care provider about: Any allergies you have. All medicines you are taking, including vitamins, herbs, eye drops, creams, and over-the-counter medicines. Any problems you or family members have had with anesthesia. Any bleeding problems you have. Any surgeries you have had. Any medical conditions you have. Whether you are pregnant or may be pregnant. What are the risks? Your health care provider will talk with you about risks. These may  include: Infection. Bruising and bleeding. Stroke or blood clots. Damage to nearby structures or organs. Allergic reaction to medicines or dyes. Needing a pacemaker if the heart gets damaged. A pacemaker is a device that helps the heart beat normally. Failure of the procedure. A repeat procedure may be needed. What happens before the procedure? Medicines Ask your health care provider about: Changing or stopping your regular medicines. These include any heart rhythm medicines, diabetes medicines, or blood thinners you take. Taking medicines such as aspirin and ibuprofen. These medicines can thin your blood. Do not take them unless your health care provider tells you to. Taking over-the-counter medicines, vitamins, herbs, and supplements. General instructions Follow instructions from your health care provider about what you may eat and drink. If you will be going home right after the procedure, plan to have a responsible adult: Take you home from the hospital or clinic. You will not be allowed to drive. Care for you for the time you are told. Ask your health care provider what steps will be taken to prevent infection. What happens during the procedure?  An IV will be inserted into one of your veins. You may be given: A sedative. This helps you relax. Anesthesia. This will: Numb certain areas of your body. An incision will be made in your neck or your groin. A needle will be inserted through the incision and into a large vein in your neck or groin. The small, thin tube (catheter) will be inserted through the needle and moved to your heart. A type of X-ray (fluoroscopy) will be used to help guide the catheter and provide images of  the heart on a monitor. Dye may be injected through the catheter to help your surgeon see the area of the heart that needs treatment. Electrical currents will be sent from the catheter to destroy heart tissue in certain areas. There are three types of energy  that may be used to do this: Heat (radiofrequency energy). Laser energy. Extreme cold (cryoablation). When the tissue has been destroyed, the catheter will be removed. Pressure will be held on the insertion area to prevent bleeding. A bandage (dressing) will be placed over the insertion area. The procedure may vary among health care providers and hospitals. What happens after the procedure? Your blood pressure, heart rate and rhythm, breathing rate, and blood oxygen level will be monitored until you leave the hospital or clinic. Your insertion area will be checked for bleeding. You will need to lie still for a few hours. If your groin was used, you will need to keep your leg straight for a few hours after the catheter is removed. This information is not intended to replace advice given to you by your health care provider. Make sure you discuss any questions you have with your health care provider. Document Revised: 01/19/2022 Document Reviewed: 01/19/2022 Elsevier Patient Education  [employer].

## 2022-07-20 ENCOUNTER — Telehealth: Payer: Self-pay | Admitting: Internal Medicine

## 2022-07-20 DIAGNOSIS — I4892 Unspecified atrial flutter: Secondary | ICD-10-CM

## 2022-07-20 NOTE — Telephone Encounter (Signed)
Patient called in stating he is ready to schd his procedure. Please advise

## 2022-07-21 DIAGNOSIS — Z0000 Encounter for general adult medical examination without abnormal findings: Secondary | ICD-10-CM | POA: Diagnosis not present

## 2022-07-21 DIAGNOSIS — I2510 Atherosclerotic heart disease of native coronary artery without angina pectoris: Secondary | ICD-10-CM | POA: Diagnosis not present

## 2022-07-21 DIAGNOSIS — Z79899 Other long term (current) drug therapy: Secondary | ICD-10-CM | POA: Diagnosis not present

## 2022-07-21 DIAGNOSIS — E7800 Pure hypercholesterolemia, unspecified: Secondary | ICD-10-CM | POA: Diagnosis not present

## 2022-07-21 DIAGNOSIS — K219 Gastro-esophageal reflux disease without esophagitis: Secondary | ICD-10-CM | POA: Diagnosis not present

## 2022-07-21 DIAGNOSIS — I10 Essential (primary) hypertension: Secondary | ICD-10-CM | POA: Diagnosis not present

## 2022-07-21 DIAGNOSIS — E042 Nontoxic multinodular goiter: Secondary | ICD-10-CM | POA: Diagnosis not present

## 2022-07-21 DIAGNOSIS — Z23 Encounter for immunization: Secondary | ICD-10-CM | POA: Diagnosis not present

## 2022-07-21 NOTE — Telephone Encounter (Signed)
Patient called again stating he would like to schedule his procedure.  Patient stated he has appointments this morning but will be available after 1:00 pm today (12/6).

## 2022-07-22 NOTE — Telephone Encounter (Signed)
Pt called back;  Pt told / explained 08/23/22 was not an option now for his Atrial Flutter Ablation.    After conversation, Pt chose 09/15/2022 at 830 am with Dr. Lovena Le.    Cath Lab called and procedure scheduled.  Message sent to [employer] and [place].    Carto / Anesthesia notified.    Pt wants to come in 08/23/2022 for blood draw CBC and BMET.  Need pre-procedure letter, labs, follow up appointment, and orders.    Will forward to scheduling for follow up.

## 2022-07-22 NOTE — Telephone Encounter (Signed)
Patient is calling back due to not being called back regarding this. He states he would like to schedule the procedure for 08/23/21. Patient requested he be called back today even if unable to schedule today so he is aware of what is going on. Please advise.

## 2022-07-28 ENCOUNTER — Other Ambulatory Visit: Payer: Medicare HMO

## 2022-07-28 NOTE — Addendum Note (Signed)
Addended by: Carylon Perches on: 07/28/2022 12:29 PM   Modules accepted: Orders

## 2022-07-28 NOTE — Telephone Encounter (Signed)
Work up complete for flutter ablation...  Pt will have labs done on 08/23/22...  Instruction letter sent via MyChart and pt is aware of how to find it.

## 2022-08-20 DIAGNOSIS — E042 Nontoxic multinodular goiter: Secondary | ICD-10-CM | POA: Diagnosis not present

## 2022-08-23 ENCOUNTER — Ambulatory Visit: Payer: Medicare HMO | Attending: Internal Medicine

## 2022-08-23 DIAGNOSIS — I4892 Unspecified atrial flutter: Secondary | ICD-10-CM | POA: Diagnosis not present

## 2022-08-24 LAB — BASIC METABOLIC PANEL
BUN/Creatinine Ratio: 13 (ref 10–24)
BUN: 13 mg/dL (ref 8–27)
CO2: 25 mmol/L (ref 20–29)
Calcium: 9.3 mg/dL (ref 8.6–10.2)
Chloride: 106 mmol/L (ref 96–106)
Creatinine, Ser: 0.98 mg/dL (ref 0.76–1.27)
Glucose: 120 mg/dL — ABNORMAL HIGH (ref 70–99)
Potassium: 3.6 mmol/L (ref 3.5–5.2)
Sodium: 142 mmol/L (ref 134–144)
eGFR: 81 mL/min/{1.73_m2} (ref >59)

## 2022-08-24 LAB — CBC
Hematocrit: 36.4 % — ABNORMAL LOW (ref 37.5–51.0)
Hemoglobin: 12.1 g/dL — ABNORMAL LOW (ref 13.0–17.7)
MCH: 30.3 pg (ref 26.6–33.0)
MCHC: 33.2 g/dL (ref 31.5–35.7)
MCV: 91 fL (ref 79–97)
Platelets: 186 10*3/uL (ref 150–450)
RBC: 4 x10E6/uL — ABNORMAL LOW (ref 4.14–5.80)
RDW: 13.1 % (ref 11.6–15.4)
WBC: 8.5 10*3/uL (ref 3.4–10.8)

## 2022-08-26 DIAGNOSIS — G5603 Carpal tunnel syndrome, bilateral upper limbs: Secondary | ICD-10-CM | POA: Diagnosis not present

## 2022-08-26 DIAGNOSIS — M65312 Trigger thumb, left thumb: Secondary | ICD-10-CM | POA: Diagnosis not present

## 2022-08-26 DIAGNOSIS — M65321 Trigger finger, right index finger: Secondary | ICD-10-CM | POA: Diagnosis not present

## 2022-08-26 DIAGNOSIS — M79641 Pain in right hand: Secondary | ICD-10-CM | POA: Diagnosis not present

## 2022-08-26 DIAGNOSIS — M79642 Pain in left hand: Secondary | ICD-10-CM | POA: Diagnosis not present

## 2022-09-10 DIAGNOSIS — H43813 Vitreous degeneration, bilateral: Secondary | ICD-10-CM | POA: Diagnosis not present

## 2022-09-10 DIAGNOSIS — Z961 Presence of intraocular lens: Secondary | ICD-10-CM | POA: Diagnosis not present

## 2022-09-10 DIAGNOSIS — G43B0 Ophthalmoplegic migraine, not intractable: Secondary | ICD-10-CM | POA: Diagnosis not present

## 2022-09-10 DIAGNOSIS — H401122 Primary open-angle glaucoma, left eye, moderate stage: Secondary | ICD-10-CM | POA: Diagnosis not present

## 2022-09-14 NOTE — Pre-Procedure Instructions (Signed)
Instructed patient on the following items: Arrival time 0600 Nothing to eat or drink after midnight No meds AM of procedure Responsible person to drive you home and stay with you for 24 hrs  Have you missed any doses of anti-coagulant Xarelto- hasn't missed any doses.

## 2022-09-15 ENCOUNTER — Encounter (HOSPITAL_COMMUNITY)
Admission: RE | Disposition: A | Discharge status: Home / Self Care | Payer: Self-pay | Source: Ambulatory Visit | Attending: Internal Medicine

## 2022-09-15 ENCOUNTER — Ambulatory Visit (HOSPITAL_COMMUNITY): Payer: Medicare HMO | Admitting: Anesthesiology

## 2022-09-15 ENCOUNTER — Encounter (HOSPITAL_COMMUNITY): Payer: Self-pay | Admitting: Internal Medicine

## 2022-09-15 ENCOUNTER — Ambulatory Visit (HOSPITAL_BASED_OUTPATIENT_CLINIC_OR_DEPARTMENT_OTHER): Payer: Medicare HMO | Admitting: Anesthesiology

## 2022-09-15 ENCOUNTER — Ambulatory Visit (HOSPITAL_COMMUNITY)
Admission: RE | Admit: 2022-09-15 | Discharge: 2022-09-15 | Disposition: A | Discharge status: Home / Self Care | Payer: Medicare HMO | Source: Ambulatory Visit | Attending: Internal Medicine | Admitting: Internal Medicine

## 2022-09-15 ENCOUNTER — Other Ambulatory Visit: Payer: Self-pay

## 2022-09-15 DIAGNOSIS — Z951 Presence of aortocoronary bypass graft: Secondary | ICD-10-CM | POA: Diagnosis not present

## 2022-09-15 DIAGNOSIS — I4892 Unspecified atrial flutter: Secondary | ICD-10-CM

## 2022-09-15 DIAGNOSIS — I10 Essential (primary) hypertension: Secondary | ICD-10-CM | POA: Diagnosis not present

## 2022-09-15 DIAGNOSIS — Z87891 Personal history of nicotine dependence: Secondary | ICD-10-CM | POA: Insufficient documentation

## 2022-09-15 DIAGNOSIS — E785 Hyperlipidemia, unspecified: Secondary | ICD-10-CM | POA: Insufficient documentation

## 2022-09-15 DIAGNOSIS — I483 Typical atrial flutter: Secondary | ICD-10-CM | POA: Insufficient documentation

## 2022-09-15 DIAGNOSIS — Z7901 Long term (current) use of anticoagulants: Secondary | ICD-10-CM | POA: Insufficient documentation

## 2022-09-15 DIAGNOSIS — I2510 Atherosclerotic heart disease of native coronary artery without angina pectoris: Secondary | ICD-10-CM | POA: Insufficient documentation

## 2022-09-15 HISTORY — PX: A-FLUTTER ABLATION: EP1230

## 2022-09-15 SURGERY — A-FLUTTER ABLATION
Anesthesia: General

## 2022-09-15 MED ORDER — HEPARIN SODIUM (PORCINE) 1000 UNIT/ML IJ SOLN
INTRAMUSCULAR | Status: AC
  Filled 2022-09-15: qty 10

## 2022-09-15 MED ORDER — SODIUM CHLORIDE 0.9 % IV SOLN: 250.0000 mL | INTRAVENOUS | Status: DC | PRN

## 2022-09-15 MED ORDER — SODIUM CHLORIDE 0.9% FLUSH: 3.0000 mL | Freq: Two times a day (BID) | INTRAVENOUS | Status: DC

## 2022-09-15 MED ORDER — LIDOCAINE 2% (20 MG/ML) 5 ML SYRINGE
INTRAMUSCULAR | Status: DC | PRN
  Administered 2022-09-15: 80 mg via INTRAVENOUS

## 2022-09-15 MED ORDER — FENTANYL CITRATE (PF) 100 MCG/2ML IJ SOLN
INTRAMUSCULAR | Status: AC
  Filled 2022-09-15: qty 2

## 2022-09-15 MED ORDER — SODIUM CHLORIDE 0.9 % IV SOLN: INTRAVENOUS | Status: DC

## 2022-09-15 MED ORDER — ONDANSETRON HCL 4 MG/2ML IJ SOLN: 4.0000 mg | Freq: Four times a day (QID) | INTRAMUSCULAR | Status: DC | PRN

## 2022-09-15 MED ORDER — HEPARIN SODIUM (PORCINE) 1000 UNIT/ML IJ SOLN
INTRAMUSCULAR | Status: DC | PRN
  Administered 2022-09-15: 1000 [IU] via INTRAVENOUS

## 2022-09-15 MED ORDER — SODIUM CHLORIDE 0.9% FLUSH: 3.0000 mL | INTRAVENOUS | Status: DC | PRN

## 2022-09-15 MED ORDER — PHENYLEPHRINE 80 MCG/ML (10ML) SYRINGE FOR IV PUSH (FOR BLOOD PRESSURE SUPPORT)
PREFILLED_SYRINGE | INTRAVENOUS | Status: DC | PRN
  Administered 2022-09-15: 160 ug via INTRAVENOUS

## 2022-09-15 MED ORDER — ONDANSETRON HCL 4 MG/2ML IJ SOLN
INTRAMUSCULAR | Status: DC | PRN
  Administered 2022-09-15: 4 mg via INTRAVENOUS

## 2022-09-15 MED ORDER — PHENYLEPHRINE HCL-NACL 20-0.9 MG/250ML-% IV SOLN
INTRAVENOUS | Status: DC | PRN
  Administered 2022-09-15: 25 ug/min via INTRAVENOUS

## 2022-09-15 MED ORDER — SUGAMMADEX SODIUM 200 MG/2ML IV SOLN
INTRAVENOUS | Status: DC | PRN
  Administered 2022-09-15: 200 mg via INTRAVENOUS

## 2022-09-15 MED ORDER — ACETAMINOPHEN 325 MG PO TABS: 650.0000 mg | ORAL_TABLET | ORAL | Status: DC | PRN

## 2022-09-15 MED ORDER — ROCURONIUM BROMIDE 10 MG/ML (PF) SYRINGE
PREFILLED_SYRINGE | INTRAVENOUS | Status: DC | PRN
  Administered 2022-09-15: 30 mg via INTRAVENOUS
  Administered 2022-09-15: 70 mg via INTRAVENOUS

## 2022-09-15 MED ORDER — DEXAMETHASONE SODIUM PHOSPHATE 10 MG/ML IJ SOLN
INTRAMUSCULAR | Status: DC | PRN
  Administered 2022-09-15: 8 mg via INTRAVENOUS

## 2022-09-15 MED ORDER — HEPARIN (PORCINE) IN NACL 1000-0.9 UT/500ML-% IV SOLN
INTRAVENOUS | Status: DC | PRN
  Administered 2022-09-15: 500 mL

## 2022-09-15 MED ORDER — HEPARIN (PORCINE) IN NACL 1000-0.9 UT/500ML-% IV SOLN
INTRAVENOUS | Status: AC
  Filled 2022-09-15: qty 500

## 2022-09-15 MED ORDER — PROPOFOL 10 MG/ML IV BOLUS
INTRAVENOUS | Status: DC | PRN
  Administered 2022-09-15: 50 mg via INTRAVENOUS

## 2022-09-15 MED ORDER — FENTANYL CITRATE (PF) 250 MCG/5ML IJ SOLN
INTRAMUSCULAR | Status: DC | PRN
  Administered 2022-09-15 (×2): 50 ug via INTRAVENOUS

## 2022-09-15 SURGICAL SUPPLY — 12 items
BAG SNAP BAND KOVER 36X36 (MISCELLANEOUS) IMPLANT
CATH JOSEPH QUAD ALLRED 6F REP (CATHETERS) IMPLANT
CATH POLARIS X REPROCESSED (CATHETERS) IMPLANT
CATH SMTCH THERMOCOOL SF FJ (CATHETERS) IMPLANT
PACK EP LATEX FREE (CUSTOM PROCEDURE TRAY) ×1
PACK EP LF (CUSTOM PROCEDURE TRAY) ×1 IMPLANT
PAD DEFIB RADIO PHYSIO CONN (PAD) ×1 IMPLANT
PATCH CARTO3 (PAD) IMPLANT
SHEATH PINNACLE 6F 10CM (SHEATH) IMPLANT
SHEATH PINNACLE 7F 10CM (SHEATH) IMPLANT
SHEATH PINNACLE 8F 10CM (SHEATH) IMPLANT
TUBING SMART ABLATE COOLFLOW (TUBING) IMPLANT

## 2022-09-15 NOTE — H&P (Signed)
HPI [name] is referred for evaluation of atrial flutter. He is a pleasant 77 yo man with CAD, dyslipidemia, and s/p CABG who developed typical atrial flutter which stopped spontaneously. He has been anti-coagulated as his CHADSVASC score is 3 and soon to be 4. He did not feel palpitations but noted that something was wrong when he was walking up a steep hill. He had to stop, not his usual. He was found to be in typical atrial flutter.      Allergies  Allergen Reactions   Caine-1 [Lidocaine]        novacaine causing throat swelling   Penicillins        Swelling throat   Procaine Anaphylaxis and Other (See Comments)   Lisinopril Cough              Current Outpatient Medications  Medication Sig Dispense Refill   atorvastatin (LIPITOR) 80 MG tablet Take 1 tablet (80 mg total) by mouth daily. 90 tablet 3   carvedilol (COREG) 6.25 MG tablet Take 1 tablet (6.25 mg total) by mouth 2 (two) times daily. 180 tablet 1   clopidogrel (PLAVIX) 75 MG tablet TAKE 1 TABLET EVERY DAY 90 tablet 3   diclofenac Sodium (VOLTAREN ARTHRITIS PAIN) 1 % GEL Apply 2 g topically daily as needed (pain).       dorzolamide-timolol (COSOPT) 22.3-6.8 MG/ML ophthalmic solution Place 1 drop into both eyes in the morning and at bedtime.       fish oil-omega-3 fatty acids 1000 MG capsule Take 1 g by mouth daily.       hydrochlorothiazide (MICROZIDE) 12.5 MG capsule Take 1 capsule (12.5 mg total) by mouth daily. 90 capsule 3   latanoprost (XALATAN) 0.005 % ophthalmic solution Place 1 drop into both eyes at bedtime.       losartan (COZAAR) 100 MG tablet Take 100 mg by mouth daily.       nitroGLYCERIN (NITROSTAT) 0.4 MG SL tablet Place 1 tablet (0.4 mg total) under the tongue every 5 (five) minutes as needed for chest pain. 90 tablet 3   rivaroxaban (XARELTO) 20 MG TABS tablet Take 1 tablet (20 mg total) by mouth daily with supper. 30 tablet 3    No current facility-administered medications for this visit.             Past Medical History:  Diagnosis Date   Hyperlipidemia     Hypertension        ROS:    All systems reviewed and negative except as noted in the HPI.          Past Surgical History:  Procedure Laterality Date   THYROIDECTOMY, PARTIAL   1999             Family History  Problem Relation Age of Onset   Colon cancer Father     Esophageal cancer Neg Hx     Rectal cancer Neg Hx     Stomach cancer Neg Hx          Social History         Socioeconomic History   Marital status: Married      Spouse name: Not on file   Number of children: Not on file   Years of education: Not on file   Highest education level: Not on file  Occupational History   Not on file  Tobacco Use   Smoking status: Former      Years: 10.00  Types: Cigarettes   Smokeless tobacco: Never  Substance and Sexual Activity   Alcohol use: Yes      Alcohol/week: 8.0 standard drinks of alcohol      Types: 8 Cans of beer per week   Drug use: No   Sexual activity: Not on file  Other Topics Concern   Not on file  Social History Narrative   Not on file    Social Determinants of Health    Financial Resource Strain: Not on file  Food Insecurity: Not on file  Transportation Needs: Not on file  Physical Activity: Not on file  Stress: Not on file  Social Connections: Not on file  Intimate Partner Violence: Not on file        BP 122/60   Pulse 65   Ht '5\' 7"'$  (1.702 m)   Wt 168 lb (76.2 kg)   SpO2 98%   BMI 26.31 kg/m    Physical Exam:   Well appearing NAD HEENT: Unremarkable Neck:  No JVD, no thyromegally Lymphatics:  No adenopathy Back:  No CVA tenderness Lungs:  Clear with no wheezes HEART:  Regular rate rhythm, no murmurs, no rubs, no clicks Abd:  soft, positive bowel sounds, no organomegally, no rebound, no guarding Ext:  2 plus pulses, no edema, no cyanosis, no clubbing Skin:  No rashes no nodules Neuro:  CN II through XII intact, motor grossly intact   EKG - NSR    Assess/Plan: Atrial flutter - I have discussed the treatment options with the patient and recommended EP study and catheter ablation vs a period of watchful waiting. The pro's and con's of both approaches were revewed and he will call us if he wishes to proceed. CAD - he did not have anginal symptoms when he was in atrial flutter. Coags -he has not had any bleeding on xarelto. HTN -his bp is controlled.   Damon Overlie Abbygayle Helfand,MD

## 2022-09-15 NOTE — Anesthesia Procedure Notes (Signed)
Procedure Name: Intubation Date/Time: 09/15/2022 8:18 AM  Performed by: Inda Coke, CRNAPre-anesthesia Checklist: Patient identified, Emergency Drugs available, Suction available, Timeout performed and Patient being monitored Patient Re-evaluated:Patient Re-evaluated prior to induction Oxygen Delivery Method: Circle system utilized Preoxygenation: Pre-oxygenation with 100% oxygen Induction Type: IV induction Ventilation: Mask ventilation without difficulty Laryngoscope Size: Mac and 4 Grade View: Grade II Tube type: Oral Tube size: 7.5 mm Number of attempts: 1 Airway Equipment and Method: Stylet Placement Confirmation: ETT inserted through vocal cords under direct vision, positive ETCO2, CO2 detector and breath sounds checked- equal and bilateral Secured at: 23 cm Tube secured with: Tape Dental Injury: Teeth and Oropharynx as per pre-operative assessment

## 2022-09-15 NOTE — Anesthesia Preprocedure Evaluation (Signed)
Anesthesia Evaluation  Patient identified by MRN, date of birth, ID band Patient awake    Reviewed: Allergy & Precautions, NPO status , Patient's Chart, lab work & pertinent test results, reviewed documented beta blocker date and time   Airway Mallampati: I       Dental no notable dental hx. (+) Teeth Intact   Pulmonary former smoker   Pulmonary exam normal        Cardiovascular hypertension, Pt. on medications and Pt. on home beta blockers + CAD and + Cardiac Stents  + dysrhythmias Atrial Fibrillation  Rhythm:Irregular Rate:Normal     Neuro/Psych negative neurological ROS  negative psych ROS   GI/Hepatic negative GI ROS, Neg liver ROS,,,  Endo/Other  negative endocrine ROS    Renal/GU negative Renal ROS     Musculoskeletal negative musculoskeletal ROS (+)    Abdominal Normal abdominal exam  (+)   Peds  Hematology   Anesthesia Other Findings   Reproductive/Obstetrics                             Anesthesia Physical Anesthesia Plan  ASA: 3  Anesthesia Plan: General   Post-op Pain Management:    Induction: Intravenous  PONV Risk Score and Plan: 2 and Ondansetron, Dexamethasone and Midazolam  Airway Management Planned: Oral ETT  Additional Equipment: None  Intra-op Plan:   Post-operative Plan: Extubation in OR  Informed Consent: I have reviewed the patients History and Physical, chart, labs and discussed the procedure including the risks, benefits and alternatives for the proposed anesthesia with the patient or authorized representative who has indicated his/her understanding and acceptance.     Dental advisory given  Plan Discussed with: CRNA  Anesthesia Plan Comments:        Anesthesia Quick Evaluation

## 2022-09-15 NOTE — Progress Notes (Signed)
SITE AREA: right groin/femoral  SITE PRIOR TO REMOVAL:  LEVEL 0  PRESSURE APPLIED FOR: approximately 10 minutes  MANUAL: yes  PATIENT STATUS DURING PULL: stable  POST PULL SITE:  LEVEL 0  POST PULL INSTRUCTIONS GIVEN: yes, drsg x24 hrs, no running, jumping etc, no sitting in hot tubs, bath tubs, pools, shower only for about 1 week, keep right groin area dry  POST PULL PULSES PRESENT: bilateral pedal pulses at +2  DRESSING APPLIED: gauze with tegaderm  BEDREST BEGINS @ 1031  COMMENTS:

## 2022-09-15 NOTE — Transfer of Care (Signed)
Immediate Anesthesia Transfer of Care Note  Patient: Damon Sanders  Procedure(s) Performed: A-FLUTTER ABLATION  Patient Location: Cath Lab  Anesthesia Type:General  Level of Consciousness: awake, alert , and oriented  Airway & Oxygen Therapy: Patient Spontanous Breathing and Patient connected to nasal cannula oxygen  Post-op Assessment: Report given to RN, Post -op Vital signs reviewed and stable, and Patient moving all extremities X 4  Post vital signs: Reviewed and stable  Last Vitals:  Vitals Value Taken Time  BP 137/64 09/15/22 1004  Temp    Pulse 69 09/15/22 1009  Resp 14 09/15/22 1009  SpO2 98 % 09/15/22 1009  Vitals shown include unvalidated device data.  Last Pain:  Vitals:   09/15/22 0634  PainSc: 0-No pain         Complications: There were no known notable events for this encounter.

## 2022-09-15 NOTE — Discharge Instructions (Addendum)

## 2022-09-16 ENCOUNTER — Encounter (HOSPITAL_COMMUNITY): Payer: Self-pay | Admitting: Internal Medicine

## 2022-09-17 NOTE — Anesthesia Postprocedure Evaluation (Signed)
Anesthesia Post Note  Patient: Damon Sanders  Procedure(s) Performed: A-FLUTTER ABLATION     Patient location during evaluation: PACU Anesthesia Type: General Level of consciousness: awake Pain management: pain level controlled Vital Signs Assessment: post-procedure vital signs reviewed and stable Cardiovascular status: stable Postop Assessment: no apparent nausea or vomiting Anesthetic complications: no  There were no known notable events for this encounter.  Last Vitals:  Vitals:   09/15/22 1600 09/15/22 1630  BP: 135/76 137/70  Pulse: 84 83  Resp: 17 18  Temp:    SpO2: 95% 95%    Last Pain:  Vitals:   09/15/22 1117  TempSrc:   PainSc: 0-No pain   Pain Goal:                   Huston Foley

## 2022-09-23 DIAGNOSIS — R0989 Other specified symptoms and signs involving the circulatory and respiratory systems: Secondary | ICD-10-CM | POA: Diagnosis not present

## 2022-09-23 DIAGNOSIS — I10 Essential (primary) hypertension: Secondary | ICD-10-CM | POA: Diagnosis not present

## 2022-09-23 DIAGNOSIS — M79641 Pain in right hand: Secondary | ICD-10-CM | POA: Diagnosis not present

## 2022-09-23 DIAGNOSIS — M79642 Pain in left hand: Secondary | ICD-10-CM | POA: Diagnosis not present

## 2022-10-10 NOTE — Progress Notes (Unsigned)
Office Visit    Patient Name: Damon Sanders Date of Encounter: 10/10/2022  Primary Care Provider:  Kathalene Frames, MD Primary Cardiologist:  Damon Grooms, MD Primary Electrophysiologist: None  Chief Complaint    Damon Sanders is a 77 y.o. male with PMH of CAD s/p STEMI 12/2018 with DES to mid RCA, HLD, HTN who presents today for follow-up following atrial flutter ablation.  Past Medical History    Past Medical History:  Diagnosis Date   Hyperlipidemia    Hypertension    Past Surgical History:  Procedure Laterality Date   A-FLUTTER ABLATION N/A 09/15/2022   Procedure: A-FLUTTER ABLATION;  Surgeon: Damon Lance, MD;  Location: [facility];  Service: Cardiovascular;  Laterality: N/A;   THYROIDECTOMY, PARTIAL  1999    Allergies  Allergies  Allergen Reactions   Caine-1 [Lidocaine]     novacaine causing throat swelling   Penicillins Other (See Comments)    Swelling throat   Procaine Anaphylaxis and Other (See Comments)   Lisinopril Cough    History of Present Illness   Damon Sanders  is a 77 year old male with the above mention past medical history who presents today for complaint of dizziness and jaw pain.  Damon Sanders was treated for STEMI at [facility] on 01/11/2019.  He was noted to have nonobstructive CAD in other coronary arteries.  He underwent DES x1 to mid RCA.  2D echo was completed showing EF of 60-65% with normal LV size and systolic function.  He was treated with Brilinta and ASA for 1 year.  He was initially seen by Damon Sanders for surgical clearance of periodontal procedure.  He has been followed for subsequent visits for follow-up of his coronary artery disease.  He was last seen by Damon Sanders on 12/2020 for follow-up.  During visit patient had no complaints of chest pain and was euvolemic on examination.  His blood pressure was currently controlled and no medication changes were made.  During patient's  06/09/2022 visit he presented with dizziness and jaw pain.  He also endorsed shortness of breath and lightheadedness.  EKG was obtained and showed patient in variable flutter with rate of 129 bpm.  His blood pressure was 124/62.  He was started on Eliquis 5 mg twice daily and was recommended to undergo TEE/DCCV.  He presented for cardioversion 06/11/2022 but was in sinus rhythm at that time.  Patient requested referral to EP and will have completed in December.  During patient's follow-up he developed a rash on his arms and chest and was switched from Eliquis to Xarelto.  He was seen by Damon Sanders for evaluation of typical flutter.  Discussion was made regarding ablation versus watchful waiting.  He was given the option and patient elected to call if he wished to proceed.  Patient elected to proceed and underwent atrial flutter ablation on 09/15/2022 that was successful.  Since last being seen in the office patient reports***.  Patient denies chest pain, palpitations, dyspnea, PND, orthopnea, nausea, vomiting, dizziness, syncope, edema, weight gain, or early satiety.   ***Notes: Flutter ablation completed 09/15/2022 Home Medications    Current Outpatient Medications  Medication Sig Dispense Refill   atorvastatin (LIPITOR) 80 MG tablet Take 1 tablet (80 mg total) by mouth daily. 90 tablet 3   carvedilol (COREG) 6.25 MG tablet Take 1 tablet (6.25 mg total) by mouth 2 (two) times daily. 180 tablet 1   clopidogrel (PLAVIX) 75 MG tablet  TAKE 1 TABLET EVERY DAY 90 tablet 3   diclofenac Sodium (VOLTAREN ARTHRITIS PAIN) 1 % GEL Apply 2 g topically daily as needed (pain).     dorzolamide-timolol (COSOPT) 22.3-6.8 MG/ML ophthalmic solution Place 1 drop into both eyes in the morning and at bedtime.     fish oil-omega-3 fatty acids 1000 MG capsule Take 1 g by mouth daily.     hydrochlorothiazide (MICROZIDE) 12.5 MG capsule Take 1 capsule (12.5 mg total) by mouth daily. 90 capsule 3   latanoprost (XALATAN) 0.005  % ophthalmic solution Place 1 drop into both eyes at bedtime.     losartan (COZAAR) 100 MG tablet Take 100 mg by mouth daily.     nitroGLYCERIN (NITROSTAT) 0.4 MG SL tablet Place 1 tablet (0.4 mg total) under the tongue every 5 (five) minutes as needed for chest pain. 90 tablet 3   rivaroxaban (XARELTO) 20 MG TABS tablet Take 1 tablet (20 mg total) by mouth daily with supper. 30 tablet 3   No current facility-administered medications for this visit.     Review of Systems  Please see the history of present illness.    (+)*** (+)***  All other systems reviewed and are otherwise negative except as noted above.  Physical Exam    Wt Readings from Last 3 Encounters:  09/15/22 161 lb (73 kg)  07/16/22 168 lb (76.2 kg)  07/13/22 171 lb 9.6 oz (77.8 kg)   [id number] were no vitals filed for this visit.,There is no height or weight on file to calculate BMI.  Constitutional:      Appearance: Healthy appearance. Not in distress.  Neck:     Vascular: JVD normal.  Pulmonary:     Effort: Pulmonary effort is normal.     Breath sounds: No wheezing. No rales. Diminished in the bases Cardiovascular:     Normal rate. Regular rhythm. Normal S1. Normal S2.      Murmurs: There is no murmur.  Edema:    Peripheral edema absent.  Abdominal:     Palpations: Abdomen is soft non tender. There is no hepatomegaly.  Skin:    General: Skin is warm and dry.  Neurological:     General: No focal deficit present.     Mental Status: Alert and oriented to person, place and time.     Cranial Nerves: Cranial nerves are intact.  EKG/LABS/Other Studies Reviewed    ECG personally reviewed by me today - ***  Risk Assessment/Calculations:    CHA2DS2-VASc Score = 3  {Confirm score is correct.  If not, click here to update score.  REFRESH note.  :1} This indicates a 3.2% annual risk of stroke. The patient's score is based upon: CHF History: 0 HTN History: 1 Diabetes History: 0 Stroke History: 0 Vascular  Disease History: 1 Age Score: 1 Gender Score: 0   {This patient has a significant risk of stroke if diagnosed with atrial fibrillation.  Please consider VKA or DOAC agent for anticoagulation if the bleeding risk is acceptable.   You can also use the SmartPhrase .[place] for documentation.   :[id number]        Lab Results  Component Value Date   WBC 8.5 08/23/2022   HGB 12.1 (L) 08/23/2022   HCT 36.4 (L) 08/23/2022   MCV 91 08/23/2022   PLT 186 08/23/2022   Lab Results  Component Value Date   CREATININE 0.98 08/23/2022   BUN 13 08/23/2022   NA 142 08/23/2022   K 3.6 08/23/2022   CL  106 08/23/2022   CO2 25 08/23/2022   No results found for: "ALT", "AST", "GGT", "ALKPHOS", "BILITOT" No results found for: "CHOL", "HDL", "LDLCALC", "LDLDIRECT", "TRIG", "CHOLHDL"  No results found for: "HGBA1C"  Assessment & Plan    1.  Atrial flutter: -Patient presents today s/p atrial flutter ablation performed 09/15/2022. -Today he reports*** -Patient is currently on Xarelto***    2.  Essential hypertension: -Patient's blood pressure today*** -Continue Microzide 12.5 mg  and losartan 100 mg   3. Hyperlipidemia: -Last LDL was 43 -Continue atorvastatin 80 mg daily   4. Coronary artery disease: -s/p STEMI treated with DES x1 to RCA in 12/2018 -Today patient reports*** -Continue current GDMT with Plavix 75 mg, carvedilol 6.25 mg twice daily, Lipitor 80 mg daily and Nitrostat 0.4 mg as needed       Disposition: Follow-up with Damon Grooms, MD or APP in *** months {Are you ordering a CV Procedure (e.g. stress test, cath, DCCV, TEE, etc)?   Press F2        :[id number]   Medication Adjustments/Labs and Tests Ordered: Current medicines are reviewed at length with the patient today.  Concerns regarding medicines are outlined above.   Signed, Mable Fill, Marissa Nestle, NP 10/10/2022, 10:32 AM [facility]  Note:  This document was prepared using Dragon  voice recognition software and may include unintentional dictation errors.

## 2022-10-11 ENCOUNTER — Ambulatory Visit: Payer: Medicare HMO | Attending: Nurse Practitioner | Admitting: Nurse Practitioner

## 2022-10-11 ENCOUNTER — Encounter: Payer: Self-pay | Admitting: Nurse Practitioner

## 2022-10-11 VITALS — BP 138/52 | HR 67 | Ht 67.0 in | Wt 169.0 lb

## 2022-10-11 DIAGNOSIS — M79641 Pain in right hand: Secondary | ICD-10-CM

## 2022-10-11 DIAGNOSIS — I25118 Atherosclerotic heart disease of native coronary artery with other forms of angina pectoris: Secondary | ICD-10-CM

## 2022-10-11 DIAGNOSIS — I483 Typical atrial flutter: Secondary | ICD-10-CM

## 2022-10-11 DIAGNOSIS — E785 Hyperlipidemia, unspecified: Secondary | ICD-10-CM | POA: Diagnosis not present

## 2022-10-11 DIAGNOSIS — M79642 Pain in left hand: Secondary | ICD-10-CM | POA: Diagnosis not present

## 2022-10-11 DIAGNOSIS — I10 Essential (primary) hypertension: Secondary | ICD-10-CM | POA: Diagnosis not present

## 2022-10-11 LAB — CBC
Hematocrit: 34.6 % — ABNORMAL LOW (ref 37.5–51.0)
Hemoglobin: 11.8 g/dL — ABNORMAL LOW (ref 13.0–17.7)
MCH: 29.9 pg (ref 26.6–33.0)
MCHC: 34.1 g/dL (ref 31.5–35.7)
MCV: 88 fL (ref 79–97)
Platelets: 206 10*3/uL (ref 150–450)
RBC: 3.95 x10E6/uL — ABNORMAL LOW (ref 4.14–5.80)
RDW: 14.7 % (ref 11.6–15.4)
WBC: 8.4 10*3/uL (ref 3.4–10.8)

## 2022-10-11 NOTE — Patient Instructions (Addendum)
Medication Instructions:  Your physician recommends that you continue on your current medications as directed. Please refer to the Current Medication list given to you today. *If you need a refill on your cardiac medications before your next appointment, please call your pharmacy*   Lab Work: [facility] If you have labs (blood work) drawn today and your tests are completely normal, you will receive your results only by: [place] (if you have MyChart) OR A paper copy in the mail If you have any lab test that is abnormal or we need to change your treatment, we will call you to review the results.   Testing/Procedures: None ordered   Follow-Up: At [facility], you and your health needs are our priority.  As part of our continuing mission to provide you with exceptional heart care, we have created designated Provider Care Teams.  These Care Teams include your primary Cardiologist (physician) and Advanced Practice Providers (APPs -  Physician Assistants and Nurse Practitioners) who all work together to provide you with the care you need, when you need it.  We recommend signing up for the patient portal called "MyChart".  Sign up information is provided on this After Visit Summary.  MyChart is used to connect with patients for Virtual Visits (Telemedicine).  Patients are able to view lab/test results, encounter notes, upcoming appointments, etc.  Non-urgent messages can be sent to your provider as well.   To learn more about what you can do with MyChart, go to [email or web address].    Your next appointment:   6 month(s)  Provider:   Larae Grooms, MD     You have been referred to Rheumatology. Other Instructions Check your blood pressure daily for 2 weeks, then contact the office with your readings.  Your goal blood pressure is 130/80

## 2022-10-13 ENCOUNTER — Telehealth: Payer: Self-pay | Admitting: Nurse Practitioner

## 2022-10-13 NOTE — Telephone Encounter (Signed)
Per Ambrose Pancoast, NP It will be fine for him to find a new rheumatologist that is available to see him sooner.  Please let me know if have any additional questions.

## 2022-10-13 NOTE — Telephone Encounter (Signed)
Pt called in stating Jaquelyn Bitter, NP referred him to rheumatology however the soonest appt they have is June. He wants to know if there is somewhere else a referral can be written for him to go sooner. Please advise.

## 2022-10-13 NOTE — Telephone Encounter (Signed)
Patient agreeable and voiced understanding.

## 2022-10-13 NOTE — Telephone Encounter (Signed)
Is this ok? Or do you have any other recommendations?

## 2022-10-14 ENCOUNTER — Encounter: Payer: Self-pay | Admitting: Internal Medicine

## 2022-10-14 ENCOUNTER — Ambulatory Visit: Payer: Medicare HMO | Attending: Internal Medicine | Admitting: Internal Medicine

## 2022-10-14 VITALS — BP 140/68 | HR 66 | Ht 67.0 in | Wt 166.4 lb

## 2022-10-14 DIAGNOSIS — I10 Essential (primary) hypertension: Secondary | ICD-10-CM

## 2022-10-14 DIAGNOSIS — I4892 Unspecified atrial flutter: Secondary | ICD-10-CM | POA: Diagnosis not present

## 2022-10-14 NOTE — Patient Instructions (Addendum)
Medication Instructions:  Your physician has recommended you make the following change in your medication: STOP taking: Xarelto, today 10/14/2022.   Lab Work: None ordered.  If you have labs (blood work) drawn today and your tests are completely normal, you will receive your results only by: [place] (if you have MyChart) OR A paper copy in the mail If you have any lab test that is abnormal or we need to change your treatment, we will call you to review the results.  Testing/Procedures: None ordered.  Follow-Up: At [facility], you and your health needs are our priority.  As part of our continuing mission to provide you with exceptional heart care, we have created designated Provider Care Teams.  These Care Teams include your primary Cardiologist (physician) and Advanced Practice Providers (APPs -  Physician Assistants and Nurse Practitioners) who all work together to provide you with the care you need, when you need it.  We recommend signing up for the patient portal called "MyChart".  Sign up information is provided on this After Visit Summary.  MyChart is used to connect with patients for Virtual Visits (Telemedicine).  Patients are able to view lab/test results, encounter notes, upcoming appointments, etc.  Non-urgent messages can be sent to your provider as well.   To learn more about what you can do with MyChart, go to [email or web address].    Your next appointment:   AS NEEDED   The format for your next appointment:   In Person  Provider:   Cristopher Peru, MD{or one of the following Advanced Practice Providers on your designated Care Team:   Tommye Standard, [place] Legrand Como "Jonni Sanger" Chalmers Cater, [place]  Important Information About Sugar

## 2022-10-14 NOTE — Progress Notes (Signed)
HPI [name] returns today for followup. He is a pleasant 77 yo man with a h/o atrial flutter who underwent EP study and catheter ablation just over a month ago. In the interim he notes he has done well with no chest pain, sob, or syncope. He is still taking his blood thinner. Allergies  Allergen Reactions   Caine-1 [Lidocaine]     novacaine causing throat swelling   Penicillins Other (See Comments)    Swelling throat   Procaine Anaphylaxis and Other (See Comments)   Lisinopril Cough     Current Outpatient Medications  Medication Sig Dispense Refill   atorvastatin (LIPITOR) 80 MG tablet Take 1 tablet (80 mg total) by mouth daily. 90 tablet 3   carvedilol (COREG) 6.25 MG tablet Take 1 tablet (6.25 mg total) by mouth 2 (two) times daily. 180 tablet 1   clopidogrel (PLAVIX) 75 MG tablet TAKE 1 TABLET EVERY DAY 90 tablet 3   diclofenac Sodium (VOLTAREN ARTHRITIS PAIN) 1 % GEL Apply 2 g topically daily as needed (pain).     dorzolamide-timolol (COSOPT) 22.3-6.8 MG/ML ophthalmic solution Place 1 drop into both eyes in the morning and at bedtime.     fish oil-omega-3 fatty acids 1000 MG capsule Take 1 g by mouth daily.     hydrochlorothiazide (MICROZIDE) 12.5 MG capsule Take 1 capsule (12.5 mg total) by mouth daily. 90 capsule 3   latanoprost (XALATAN) 0.005 % ophthalmic solution Place 1 drop into both eyes at bedtime.     losartan (COZAAR) 100 MG tablet Take 100 mg by mouth daily.     nitroGLYCERIN (NITROSTAT) 0.4 MG SL tablet Place 1 tablet (0.4 mg total) under the tongue every 5 (five) minutes as needed for chest pain. 90 tablet 3   No current facility-administered medications for this visit.     Past Medical History:  Diagnosis Date   Hyperlipidemia    Hypertension     ROS:   All systems reviewed and negative except as noted in the HPI.   Past Surgical History:  Procedure Laterality Date   A-FLUTTER ABLATION N/A 09/15/2022   Procedure: A-FLUTTER ABLATION;   Surgeon: Evans Lance, MD;  Location: [facility];  Service: Cardiovascular;  Laterality: N/A;   THYROIDECTOMY, PARTIAL  1999     Family History  Problem Relation Age of Onset   Colon cancer Father    Esophageal cancer Neg Hx    Rectal cancer Neg Hx    Stomach cancer Neg Hx      Social History   Socioeconomic History   Marital status: Married    Spouse name: Not on file   Number of children: Not on file   Years of education: Not on file   Highest education level: Not on file  Occupational History   Not on file  Tobacco Use   Smoking status: Former    Years: 10.00    Types: Cigarettes   Smokeless tobacco: Never  Substance and Sexual Activity   Alcohol use: Yes    Alcohol/week: 8.0 standard drinks of alcohol    Types: 8 Cans of beer per week   Drug use: No   Sexual activity: Not on file  Other Topics Concern   Not on file  Social History Narrative   Not on file   Social Determinants of Health   Financial Resource Strain: Not on file  Food Insecurity: Not on file  Transportation Needs: Not on file  Physical Activity: Not  on file  Stress: Not on file  Social Connections: Not on file  Intimate Partner Violence: Not on file     BP (!) 140/68   Pulse 66   Ht '5\' 7"'$  (1.702 m)   Wt 166 lb 6.4 oz (75.5 kg)   SpO2 98%   BMI 26.06 kg/m   Physical Exam:  Well appearing NAD HEENT: Unremarkable Neck:  No JVD, no thyromegally Lymphatics:  No adenopathy Back:  No CVA tenderness Lungs:  Clear with no wheezes HEART:  Regular rate rhythm, no murmurs, no rubs, no clicks Abd:  soft, positive bowel sounds, no organomegally, no rebound, no guarding Ext:  2 plus pulses, no edema, no cyanosis, no clubbing Skin:  No rashes no nodules Neuro:  CN II through XII intact, motor grossly intact  EKG - nsr with RBBB   Assess/Plan: Atrial flutter - he is doing well s/p catheter ablation. No recurrent symptoms.  Coags - I asked him to stop his blood thinner.  CAD -  he denies anginal symptoms.  HTN - his sbp is a little high today but better at home. We will follow.  Carleene Overlie Azhane Eckart,MD

## 2022-10-26 DIAGNOSIS — M79642 Pain in left hand: Secondary | ICD-10-CM | POA: Diagnosis not present

## 2022-10-26 DIAGNOSIS — M79641 Pain in right hand: Secondary | ICD-10-CM | POA: Diagnosis not present

## 2023-01-09 ENCOUNTER — Other Ambulatory Visit: Payer: Self-pay | Admitting: Nurse Practitioner

## 2023-01-17 DIAGNOSIS — H26491 Other secondary cataract, right eye: Secondary | ICD-10-CM | POA: Diagnosis not present

## 2023-01-17 DIAGNOSIS — Z961 Presence of intraocular lens: Secondary | ICD-10-CM | POA: Diagnosis not present

## 2023-01-17 DIAGNOSIS — G43B0 Ophthalmoplegic migraine, not intractable: Secondary | ICD-10-CM | POA: Diagnosis not present

## 2023-01-17 DIAGNOSIS — H43813 Vitreous degeneration, bilateral: Secondary | ICD-10-CM | POA: Diagnosis not present

## 2023-01-17 DIAGNOSIS — H401122 Primary open-angle glaucoma, left eye, moderate stage: Secondary | ICD-10-CM | POA: Diagnosis not present

## 2023-01-24 DIAGNOSIS — I10 Essential (primary) hypertension: Secondary | ICD-10-CM | POA: Diagnosis not present

## 2023-01-24 DIAGNOSIS — E042 Nontoxic multinodular goiter: Secondary | ICD-10-CM | POA: Diagnosis not present

## 2023-01-24 DIAGNOSIS — I2510 Atherosclerotic heart disease of native coronary artery without angina pectoris: Secondary | ICD-10-CM | POA: Diagnosis not present

## 2023-01-24 DIAGNOSIS — I4892 Unspecified atrial flutter: Secondary | ICD-10-CM | POA: Diagnosis not present

## 2023-01-24 DIAGNOSIS — K219 Gastro-esophageal reflux disease without esophagitis: Secondary | ICD-10-CM | POA: Diagnosis not present

## 2023-02-14 ENCOUNTER — Telehealth: Payer: Self-pay

## 2023-02-14 DIAGNOSIS — Z1211 Encounter for screening for malignant neoplasm of colon: Secondary | ICD-10-CM | POA: Diagnosis not present

## 2023-02-14 DIAGNOSIS — Z7901 Long term (current) use of anticoagulants: Secondary | ICD-10-CM | POA: Diagnosis not present

## 2023-02-14 DIAGNOSIS — Z800 Family history of malignant neoplasm of digestive organs: Secondary | ICD-10-CM | POA: Diagnosis not present

## 2023-02-14 NOTE — Telephone Encounter (Signed)
   Patient Name: Damon Sanders  DOB: [birth year] MRN: [id number]  Primary Cardiologist: Lance Muss, MD  Chart reviewed as part of pre-operative protocol coverage. Given past medical history and time since last visit, based on ACC/AHA guidelines, Damon Sanders is at acceptable risk for the planned procedure without further cardiovascular testing.   Per protocol patient can hold Plavix 5 days prior to procedure and should restart postprocedure when surgically safe and hemostasis is achieved.  I will route this recommendation to the requesting party via Epic fax function and remove from pre-op pool.  Please call with questions.  Napoleon Form, Leodis Rains, NP 02/14/2023, 10:46 AM

## 2023-02-14 NOTE — Telephone Encounter (Signed)
   Pre-operative Risk Assessment    Patient Name: Damon Sanders  DOB: 07/31/1946 MRN: [id number]     Request for Surgical Clearance    Procedure:  Colonoscopy   Date of Surgery:  Clearance 03/02/23                                 Surgeon:  Dr. Bosie Clos Surgeon's Group or Practice Name:  [facility] Phone number:  [phone] Fax number:  [phone]   Type of Clearance Requested:   - Pharmacy:  Hold Clopidogrel (Plavix)     Type of Anesthesia:  Propofol    Additional requests/questions:    [name]   02/14/2023, 10:34 AM

## 2023-02-28 ENCOUNTER — Other Ambulatory Visit: Payer: Self-pay | Admitting: Interventional Cardiology

## 2023-03-02 DIAGNOSIS — K648 Other hemorrhoids: Secondary | ICD-10-CM | POA: Diagnosis not present

## 2023-03-02 DIAGNOSIS — D123 Benign neoplasm of transverse colon: Secondary | ICD-10-CM | POA: Diagnosis not present

## 2023-03-02 DIAGNOSIS — Z1211 Encounter for screening for malignant neoplasm of colon: Secondary | ICD-10-CM | POA: Diagnosis not present

## 2023-03-02 DIAGNOSIS — D125 Benign neoplasm of sigmoid colon: Secondary | ICD-10-CM | POA: Diagnosis not present

## 2023-03-02 DIAGNOSIS — Z800 Family history of malignant neoplasm of digestive organs: Secondary | ICD-10-CM | POA: Diagnosis not present

## 2023-03-04 DIAGNOSIS — D125 Benign neoplasm of sigmoid colon: Secondary | ICD-10-CM | POA: Diagnosis not present

## 2023-03-04 DIAGNOSIS — D123 Benign neoplasm of transverse colon: Secondary | ICD-10-CM | POA: Diagnosis not present

## 2023-03-15 NOTE — Progress Notes (Signed)
Office Visit Note  Patient: Damon Sanders             Date of Birth: 06/05/1946           MRN: [id number]             PCP: Emilio Aspen, MD Referring: Gaston Islam., NP Visit Date: 03/29/2023 Occupation: @GUAROCC @  Subjective:  Pain in both hands  History of Present Illness: Damon Sanders is a 77 y.o. male seen in consultation per request of his PCP for the evaluation of hand pain.  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  He is left-handed.  He states about 5 months ago he started having severe pain in his left thumb which she describes in the PIP joint.  He states he was having nocturnal pain which was waking him up at night.  He was referred to Dr. Ronie Spies office and was seen by a PA.  He states he had x-rays of his bilateral hands and was told that he had osteoarthritis.  He was given a prednisone taper.  At some point he also had nerve conduction velocities.  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  He was given bilateral carpal tunnel braces which did not help much.  Later he was also given a left thumb brace which did not help much.  He had bilateral carpal  syndrome injections which helped for about a month and then the symptoms recurred.  He denies any numbness or tingling in his hands.  He states his hands are still swollen.  He has noticed decreased grip strength.  He also has intermittent discomfort in his shoulders left more than right.  The other joints are painful.  There is no family history of autoimmune disease.  He does not have any siblings or children.    Activities of Daily Living:  Patient reports morning stiffness for 5-10 minutes.   Patient Reports nocturnal pain.  Difficulty dressing/grooming: Denies Difficulty climbing stairs: Denies Difficulty getting out of chair: Denies Difficulty using hands for taps, buttons, cutlery, and/or writing:  Denies  Review of Systems  Constitutional:  Negative for fatigue.  HENT:  Negative for mouth sores and mouth dryness.   Eyes:  Negative for dryness.  Respiratory:  Negative for shortness of breath.   Cardiovascular:  Negative for chest pain and palpitations.  Gastrointestinal:  Negative for blood in stool, constipation and diarrhea.  Endocrine: Negative for increased urination.  Genitourinary:  Negative for involuntary urination.  Musculoskeletal:  Positive for joint pain, joint pain, joint swelling, muscle weakness and morning stiffness. Negative for gait problem, myalgias, muscle tenderness and myalgias.  Skin:  Negative for color change, rash, hair loss and sensitivity to sunlight.  Allergic/Immunologic: Negative for susceptible to infections.  Neurological:  Negative for dizziness and headaches.  Hematological:  Negative for swollen glands.  Psychiatric/Behavioral:  Positive for sleep disturbance. Negative for depressed mood. The patient is not nervous/anxious.     PMFS History:  Patient Active Problem List   Diagnosis Date Noted   Benign essential HTN 10/14/2022   Atrial flutter (HCC) 07/16/2022    Past Medical History:  Diagnosis Date   Hyperlipidemia    Hypertension     Family History  Problem Relation Age of Onset   Colon cancer Father    Esophageal cancer Neg Hx    Rectal cancer Neg Hx    Stomach  cancer Neg Hx    Past Surgical History:  Procedure Laterality Date   A-FLUTTER ABLATION N/A 09/15/2022   Procedure: A-FLUTTER ABLATION;  Surgeon: Marinus Maw, MD;  Location: [facility];  Service: Cardiovascular;  Laterality: N/A;   LEFT HEART CATH  01/11/2019   THYROIDECTOMY, PARTIAL  08/16/1997   Social History   Social History Narrative   Not on file   Immunization History  Administered Date(s) Administered   PFIZER(Purple Top)SARS-COV-2 Vaccination 09/30/2019, 10/23/2019, 07/19/2020     Objective: Vital Signs: BP (!) 143/67 (BP Location: Left  Arm, Patient Position: Sitting, Cuff Size: Normal)   Pulse (!) 57   Resp 16   Ht 5\' 7"  (1.702 m)   Wt 167 lb 3.2 oz (75.8 kg)   BMI 26.19 kg/m    Physical Exam Vitals and nursing note reviewed.  Constitutional:      Appearance: He is well-developed.  HENT:     Head: Normocephalic and atraumatic.  Eyes:     Conjunctiva/sclera: Conjunctivae normal.     Pupils: Pupils are equal, round, and reactive to light.  Cardiovascular:     Rate and Rhythm: Normal rate and regular rhythm.     Heart sounds: Normal heart sounds.  Pulmonary:     Effort: Pulmonary effort is normal.     Breath sounds: Normal breath sounds.  Abdominal:     General: Bowel sounds are normal.     Palpations: Abdomen is soft.  Musculoskeletal:     Cervical back: Normal range of motion and neck supple.  Skin:    General: Skin is warm and dry.     Capillary Refill: Capillary refill takes less than 2 seconds.  Neurological:     Mental Status: He is alert and oriented to person, place, and time.  Psychiatric:        Behavior: Behavior normal.      Musculoskeletal Exam: Cervical, thoracic and lumbar spine with good range of motion.  He had shoulder joint abduction limited to about 120 degrees.  He had limited internal rotation.  Elbow joints and wrist joints were in good range of motion.  There was no synovitis or synovial thickening over MCP joints.  Bilateral PIP and DIP thickening with no synovitis was noted.  Hip joints and knee joints in good range of motion.  He had bilateral pes cavus and hammertoes.  No synovitis was noted.  CDAI Exam: CDAI Score: -- Patient Global: --; Provider Global: -- Swollen: --; Tender: -- Joint Exam 03/29/2023   No joint exam has been documented for this visit   There is currently no information documented on the homunculus. Go to the Rheumatology activity and complete the homunculus joint exam.  Investigation: No additional findings.  Imaging: XR Hand 2 View Left  Result  Date: 03/29/2023 Severe CMC narrowing and subluxation with spurring was noted.  Narrowing of first, second and third MCP joints was noted.  PIP and DIP narrowing was noted.  No intercarpal radiocarpal joint space narrowing was noted.  No erosive changes were noted. Impression: These findings are suggestive of osteoarthritis of the hand.  XR Hand 2 View Right  Result Date: 03/29/2023 Severe CMC, PIP and DIP narrowing was noted.  Spurring of first PIP joint was noted.  Narrowing of  third MCP joint was noted.  No intercarpal radiocarpal joint space narrowing was noted.  No erosive changes were noted. Impression: These findings are suggestive of osteoarthritis of the hand.  XR Shoulder Left  Result Date:  03/29/2023 No glenohumeral or acromioclavicular joint space narrowing was noted.  No chondrocalcinosis was noted. Impression: Unremarkable x-rays of the shoulder.  XR Shoulder Right  Result Date: 03/29/2023 No glenohumeral or acromioclavicular joint space narrowing was noted.  No chondrocalcinosis was noted. Impression: Unremarkable x-rays of the shoulder.   Recent Labs: Lab Results  Component Value Date   WBC 8.4 10/11/2022   HGB 11.8 (L) 10/11/2022   PLT 206 10/11/2022   NA 142 08/23/2022   K 3.6 08/23/2022   CL 106 08/23/2022   CO2 25 08/23/2022   GLUCOSE 120 (H) 08/23/2022   BUN 13 08/23/2022   CREATININE 0.98 08/23/2022   CALCIUM 9.3 08/23/2022   GFRAA  03/15/2007    >60        The eGFR has been calculated using the MDRD equation. This calculation has not been validated in all clinical    Speciality Comments: No specialty comments available.  Procedures:  Large Joint Inj: L glenohumeral on 03/29/2023 9:56 AM Indications: pain Details: 27 G 1.5 in needle, posterior approach  Arthrogram: No  Medications: 1 mL lidocaine 1 %; 40 mg triamcinolone acetonide 40 MG/ML Aspirate: 0 mL Outcome: tolerated well, no immediate complications Procedure, treatment alternatives, risks  and benefits explained, specific risks discussed. Consent was given by the patient. Immediately prior to procedure a time out was called to verify the correct patient, procedure, equipment, support staff and site/side marked as required. Patient was prepped and draped in the usual sterile fashion.     Allergies: Caine-1 [lidocaine], Penicillins, Procaine, and Lisinopril   Assessment / Plan:     Visit Diagnoses: Bilateral hand pain -patient complains of pain and discomfort in his bilateral hands for at least 5 months.  He had been followed by Dr. Mina Marble.  Patient states he was diagnosed with right carpal tunnel syndrome.  After having nerve conduction velocities.  He had bilateral carpal tunnel syndrome injections which helped only for a month.  He denies any history of paresthesias.  He gives history of decreased grip strength and difficulty making a fist.  He is very active he does woodwork and yard work.  He used to be an Soil scientist in the past.  He had bilateral PIP and DIP thickening on the examination.  He is left-handed and the symptoms are more prominent on the left side.  He has difficulty making a fist.  I will obtain x-rays and labs today.  Detailed counseling osteoarthritis was provided.  A handout was placed in the AVS.  I gave him a handout on hand exercises.  I will also refer him to physical therapy.  Plan: XR Hand 2 View Right, XR Hand 2 View Left,.  X-rays showed bilateral CMC PIP and DIP narrowing consistent with osteoarthritis.  No erosive changes were noted.  Sedimentation rate, Uric acid, Rheumatoid factor, Cyclic citrul peptide antibody, IgG  Chronic pain of both shoulders -he complains of pain and discomfort in his bilateral shoulders.  He has difficulty raising his arms.  He states he has had shoulder joint injections in the past by an orthopedic surgeon several years ago.  He had limited range of motion of bilateral shoulders today.  Patient requests cortisone  injection to his left shoulder joint due to nocturnal pain and discomfort.  I will refer him to physical therapy.  A handout on exercises was given.  Plan: XR Shoulder Left, XR Shoulder Right.  X-rays of bilateral shoulder joints were unremarkable.  Per patient's request left shoulder joint  was prepped in sterile fashion and injected with lidocaine and Kenalog as described above.  Patient tolerated procedure well.  Postprocedure instructions were given.  Pes cavus of both feet-bilateral pes cavus was noted.  He also had hammertoes.  He will benefit from arch support.  Patient states that he has arch support from his previous podiatrist but he does not wear them.  Hammertoes of both feet-he will benefit from metatarsal pads.  Other medical problems are listed as follows:  Typical atrial flutter (HCC)  Benign essential HTN-blood pressure mildly elevated at 143/67.  He was advised to monitor blood pressure closely and follow-up with his PCP.  Coronary artery disease involving native coronary artery of native heart with other form of angina pectoris (HCC)  History of hyperlipidemia  History of glaucoma  Orders: Orders Placed This Encounter  Procedures   Large Joint Inj: L glenohumeral   XR Shoulder Left   XR Shoulder Right   XR Hand 2 View Right   XR Hand 2 View Left   Sedimentation rate   Uric acid   Rheumatoid factor   Cyclic citrul peptide antibody, IgG   Ambulatory referral to Physical Therapy   No orders of the defined types were placed in this encounter.    Follow-Up Instructions: Return if symptoms worsen or fail to improve, for Osteoarthritis.   Pollyann Savoy, MD  Note - This record has been created using Animal nutritionist.  Chart creation errors have been sought, but may not always  have been located. Such creation errors do not reflect on  the standard of medical care.

## 2023-03-29 ENCOUNTER — Ambulatory Visit: Payer: Medicare HMO

## 2023-03-29 ENCOUNTER — Ambulatory Visit: Payer: Medicare HMO | Admitting: Rheumatology

## 2023-03-29 ENCOUNTER — Encounter: Payer: Self-pay | Admitting: Rheumatology

## 2023-03-29 ENCOUNTER — Ambulatory Visit (INDEPENDENT_AMBULATORY_CARE_PROVIDER_SITE_OTHER): Payer: Medicare HMO

## 2023-03-29 VITALS — BP 143/67 | HR 57 | Resp 16 | Ht 67.0 in | Wt 167.2 lb

## 2023-03-29 DIAGNOSIS — Q6671 Congenital pes cavus, right foot: Secondary | ICD-10-CM

## 2023-03-29 DIAGNOSIS — Z8669 Personal history of other diseases of the nervous system and sense organs: Secondary | ICD-10-CM

## 2023-03-29 DIAGNOSIS — Z8639 Personal history of other endocrine, nutritional and metabolic disease: Secondary | ICD-10-CM

## 2023-03-29 DIAGNOSIS — M25511 Pain in right shoulder: Secondary | ICD-10-CM

## 2023-03-29 DIAGNOSIS — M79641 Pain in right hand: Secondary | ICD-10-CM

## 2023-03-29 DIAGNOSIS — M25512 Pain in left shoulder: Secondary | ICD-10-CM | POA: Diagnosis not present

## 2023-03-29 DIAGNOSIS — Q6672 Congenital pes cavus, left foot: Secondary | ICD-10-CM

## 2023-03-29 DIAGNOSIS — G8929 Other chronic pain: Secondary | ICD-10-CM | POA: Diagnosis not present

## 2023-03-29 DIAGNOSIS — I483 Typical atrial flutter: Secondary | ICD-10-CM

## 2023-03-29 DIAGNOSIS — I25118 Atherosclerotic heart disease of native coronary artery with other forms of angina pectoris: Secondary | ICD-10-CM

## 2023-03-29 DIAGNOSIS — M79642 Pain in left hand: Secondary | ICD-10-CM | POA: Diagnosis not present

## 2023-03-29 DIAGNOSIS — M2042 Other hammer toe(s) (acquired), left foot: Secondary | ICD-10-CM

## 2023-03-29 DIAGNOSIS — M2041 Other hammer toe(s) (acquired), right foot: Secondary | ICD-10-CM | POA: Diagnosis not present

## 2023-03-29 DIAGNOSIS — I10 Essential (primary) hypertension: Secondary | ICD-10-CM

## 2023-03-29 LAB — SEDIMENTATION RATE: Sed Rate: 2 mm/h (ref 0–20)

## 2023-03-29 MED ORDER — TRIAMCINOLONE ACETONIDE 40 MG/ML IJ SUSP
40.0000 mg | INTRAMUSCULAR | Status: AC | PRN
Start: 2023-03-29 — End: 2023-03-29
  Administered 2023-03-29: 40 mg via INTRA_ARTICULAR

## 2023-03-29 MED ORDER — LIDOCAINE HCL 1 % IJ SOLN
1.0000 mL | INTRAMUSCULAR | Status: AC | PRN
Start: 2023-03-29 — End: 2023-03-29
  Administered 2023-03-29: 1 mL

## 2023-03-29 NOTE — Patient Instructions (Addendum)
Osteoarthritis  Osteoarthritis is a type of arthritis. It refers to joint pain or joint disease. Osteoarthritis affects tissue that covers the ends of bones in joints (cartilage). Cartilage acts as a cushion between the bones and helps them move smoothly. Osteoarthritis occurs when cartilage in the joints gets worn down. Osteoarthritis is sometimes called "wear and tear" arthritis. Osteoarthritis is the most common form of arthritis. It often occurs in older people. It is a condition that gets worse over time. The joints most often affected by this condition are in the fingers, toes, hips, knees, and spine, including the neck and lower back. What are the causes? This condition is caused by the wearing down of cartilage that covers the ends of bones. What increases the risk? The following factors may make you more likely to develop this condition: Being age 58 or older. Obesity. Overuse of joints. Past injury of a joint. Past surgery on a joint. Family history of osteoarthritis. What are the signs or symptoms? The main symptoms of this condition are pain, swelling, and stiffness in the joint. Other symptoms may include: An enlarged joint. More pain and further damage caused by small pieces of bone or cartilage that break off and float inside of the joint. Small deposits of bone (osteophytes) that grow on the edges of the joint. A grating or scraping feeling inside the joint when you move it. Popping or creaking sounds when you move. Difficulty walking or exercising. An inability to grip items, twist your hand, or control the movements of your hands and fingers. How is this diagnosed? This condition may be diagnosed based on: Your medical history. A physical exam. Your symptoms. X-rays of the affected joints. Blood tests to rule out other types of arthritis. How is this treated? There is no cure for this condition, but treatment can help control pain and improve joint function. Treatment  may include a combination of therapies, such as: Pain relief techniques, such as: Applying heat and cold to the joint. Massage. A form of talk therapy called cognitive behavioral therapy (CBT). This therapy helps you set goals and follow up on the changes that you make. Medicines for pain and inflammation. The medicines can be taken by mouth or applied to the skin. They include: NSAIDs, such as ibuprofen. Prescription medicines. Strong anti-inflammatory medicines (corticosteroids). Certain nutritional supplements. A prescribed exercise program. You may work with a physical therapist. Assistive devices, such as a brace, wrap, splint, specialized glove, or cane. A weight control plan. Surgery, such as: An osteotomy. This is done to reposition the bones and relieve pain or to remove loose pieces of bone and cartilage. Joint replacement surgery. You may need this surgery if you have advanced osteoarthritis. Follow these instructions at home: Activity Rest your affected joints as told by your health care provider. Exercise as told by your provider. The provider may recommend specific types of exercise, such as: Strengthening exercises. These are done to strengthen the muscles that support joints affected by arthritis. Aerobic activities. These are exercises, such as brisk walking or water aerobics, that increase your heart rate. Range-of-motion activities. These help your joints move more easily. Balance and agility exercises. Managing pain, stiffness, and swelling     If told, apply heat to the affected area as often as told by your provider. Use the heat source that your provider recommends, such as a moist heat pack or a heating pad. If you have a removable assistive device, remove it as told by your provider. Place a  towel between your skin and the heat source. If your provider tells you to keep the assistive device on while you apply heat, place a towel between the assistive device and  the heat source. Leave the heat on for 20-30 minutes. If told, put ice on the affected area. If you have a removable assistive device, remove it as told by your provider. Put ice in a plastic bag. Place a towel between your skin and the bag. If your provider tells you to keep the assistive device on during icing, place a towel between the assistive device and the bag. Leave the ice on for 20 minutes, 2-3 times a day. If your skin turns bright red, remove the ice or heat right away to prevent skin damage. The risk of damage is higher if you cannot feel pain, heat, or cold. Move your fingers or toes often to reduce stiffness and swelling. Raise (elevate) the affected area above the level of your heart while you are sitting or lying down. General instructions Take over-the-counter and prescription medicines only as told by your provider. Maintain a healthy weight. Follow instructions from your provider for weight control. Do not use any products that contain nicotine or tobacco. These products include cigarettes, chewing tobacco, and vaping devices, such as e-cigarettes. If you need help quitting, ask your provider. Use assistive devices as told by your provider. Where to find more information [employer]th[employer] and Musculoskeletal [employer] Skin Diseases: niams.http://www.myers.net/ [employer] on Aging: BaseRingTones.pl [employer] of Rheumatology: rheumatology.org Contact a health care provider if: You have redness, swelling, or a feeling of warmth in a joint that gets worse. You have a fever along with joint or muscle aches. You develop a rash. You have trouble doing your normal activities. You have pain that gets worse and is not relieved by pain medicine. This information is not intended to replace advice given to you by your health care provider. Make sure you discuss any questions you have with your health care provider. Document Revised: 04/01/2022 Document Reviewed:  04/01/2022 Else[employer] Patient Education  2024 [employer] Hand Exercises Hand exercises can be helpful for almost anyone. They can strengthen your hands and improve flexibility and movement. The exercises can also increase blood flow to the hands. These results can make your work and daily tasks easier for you. Hand exercises can be especially helpful for people who have joint pain from arthritis or nerve damage from using their hands over and over. These exercises can also help people who injure a hand. Exercises Most of these hand exercises are gentle stretching and motion exercises. It is usually safe to do them often throughout the day. Warming up your hands before exercise may help reduce stiffness. You can do this with gentle massage or by placing your hands in warm water for 10-15 minutes. It is normal to feel some stretching, pulling, tightness, or mild discomfort when you begin new exercises. In time, this will improve. Remember to always be careful and stop right away if you feel sudden, very bad pain or your pain gets worse. You want to get better and be safe. Ask your health care provider which exercises are safe for you. Do exercises exactly as told by your provider and adjust them as told. Do not begin these exercises until told by your provider. Knuckle bend or "claw" fist  Stand or sit with your arm, hand, and all five fingers pointed straight up. Make sure to keep your wrist straight. Gently bend  your fingers down toward your palm until the tips of your fingers are touching your palm. Keep your big knuckle straight and only bend the small knuckles in your fingers. Hold this position for 10 seconds. Straighten your fingers back to your starting position. Repeat this exercise 5-10 times with each hand. Full finger fist  Stand or sit with your arm, hand, and all five fingers pointed straight up. Make sure to keep your wrist straight. Gently bend your fingers into your palm until  the tips of your fingers are touching the middle of your palm. Hold this position for 10 seconds. Extend your fingers back to your starting position, stretching every joint fully. Repeat this exercise 5-10 times with each hand. Straight fist  Stand or sit with your arm, hand, and all five fingers pointed straight up. Make sure to keep your wrist straight. Gently bend your fingers at the big knuckle, where your fingers meet your hand, and at the middle knuckle. Keep the knuckle at the tips of your fingers straight and try to touch the bottom of your palm. Hold this position for 10 seconds. Extend your fingers back to your starting position, stretching every joint fully. Repeat this exercise 5-10 times with each hand. Tabletop  Stand or sit with your arm, hand, and all five fingers pointed straight up. Make sure to keep your wrist straight. Gently bend your fingers at the big knuckle, where your fingers meet your hand, as far down as you can. Keep the small knuckles in your fingers straight. Think of forming a tabletop with your fingers. Hold this position for 10 seconds. Extend your fingers back to your starting position, stretching every joint fully. Repeat this exercise 5-10 times with each hand. Finger spread  Place your hand flat on a table with your palm facing down. Make sure your wrist stays straight. Spread your fingers and thumb apart from each other as far as you can until you feel a gentle stretch. Hold this position for 10 seconds. Bring your fingers and thumb tight together again. Hold this position for 10 seconds. Repeat this exercise 5-10 times with each hand. Making circles  Stand or sit with your arm, hand, and all five fingers pointed straight up. Make sure to keep your wrist straight. Make a circle by touching the tip of your thumb to the tip of your index finger. Hold for 10 seconds. Then open your hand wide. Repeat this motion with your thumb and each of your  fingers. Repeat this exercise 5-10 times with each hand. Thumb motion  Sit with your forearm resting on a table and your wrist straight. Your thumb should be facing up toward the ceiling. Keep your fingers relaxed as you move your thumb. Lift your thumb up as high as you can toward the ceiling. Hold for 10 seconds. Bend your thumb across your palm as far as you can, reaching the tip of your thumb for the small finger (pinkie) side of your palm. Hold for 10 seconds. Repeat this exercise 5-10 times with each hand. Grip strengthening  Hold a stress ball or other soft ball in the middle of your hand. Slowly increase the pressure, squeezing the ball as much as you can without causing pain. Think of bringing the tips of your fingers into the middle of your palm. All of your finger joints should bend when doing this exercise. Hold your squeeze for 10 seconds, then relax. Repeat this exercise 5-10 times with each hand. Contact a health care  provider if: Your hand pain or discomfort gets much worse when you do an exercise. Your hand pain or discomfort does not improve within 2 hours after you exercise. If you have either of these problems, stop doing these exercises right away. Do not do them again unless your provider says that you can. Get help right away if: You develop sudden, severe hand pain or swelling. If this happens, stop doing these exercises right away. Do not do them again unless your provider says that you can. This information is not intended to replace advice given to you by your health care provider. Make sure you discuss any questions you have with your health care provider. Document Revised: 08/17/2022 Document Reviewed: 08/17/2022 Elsevier Patient Education  2024 [employer] Shoulder Exercises Ask your health care provider which exercises are safe for you. Do exercises exactly as told by your health care provider and adjust them as directed. It is normal to feel mild stretching,  pulling, tightness, or discomfort as you do these exercises. Stop right away if you feel sudden pain or your pain gets worse. Do not begin these exercises until told by your health care provider. Stretching exercises External rotation and abduction This exercise is sometimes called corner stretch. The exercise rotates your arm outward (external rotation) and moves your arm out from your body (abduction). Stand in a doorway with one of your feet slightly in front of the other. This is called a staggered stance. If you cannot reach your forearms to the door frame, stand facing a corner of a room. Choose one of the following positions as told by your health care provider: Place your hands and forearms on the door frame above your head. Place your hands and forearms on the door frame at the height of your head. Place your hands on the door frame at the height of your elbows. Slowly move your weight onto your front foot until you feel a stretch across your chest and in the front of your shoulders. Keep your head and chest upright and keep your abdominal muscles tight. Hold for __________ seconds. To release the stretch, shift your weight to your back foot. Repeat __________ times. Complete this exercise __________ times a day. Extension, standing  Stand and hold a broomstick, a cane, or a similar object behind your back. Your hands should be a little wider than shoulder-width apart. Your palms should face away from your back. Keeping your elbows straight and your shoulder muscles relaxed, move the stick away from your body until you feel a stretch in your shoulders (extension). Avoid shrugging your shoulders while you move the stick. Keep your shoulder blades tucked down toward the middle of your back. Hold for __________ seconds. Slowly return to the starting position. Repeat __________ times. Complete this exercise __________ times a day. Range-of-motion exercises Pendulum  Stand near a wall  or a surface that you can hold onto for balance. Bend at the waist and let your left / right arm hang straight down. Use your other arm to support you. Keep your back straight and do not lock your knees. Relax your left / right arm and shoulder muscles, and move your hips and your trunk so your left / right arm swings freely. Your arm should swing because of the motion of your body, not because you are using your arm or shoulder muscles. Keep moving your hips and trunk so your arm swings in the following directions, as told by your health care provider: Side to  side. Forward and backward. In clockwise and counterclockwise circles. Continue each motion for __________ seconds, or for as long as told by your health care provider. Slowly return to the starting position. Repeat __________ times. Complete this exercise __________ times a day. Shoulder flexion, standing  Stand and hold a broomstick, a cane, or a similar object. Place your hands a little more than shoulder-width apart on the object. Your left / right hand should be palm-up, and your other hand should be palm-down. Keep your elbow straight and your shoulder muscles relaxed. Push the stick up with your healthy arm to raise your left / right arm in front of your body, and then over your head until you feel a stretch in your shoulder (flexion). Avoid shrugging your shoulder while you raise your arm. Keep your shoulder blade tucked down toward the middle of your back. Hold for __________ seconds. Slowly return to the starting position. Repeat __________ times. Complete this exercise __________ times a day. Shoulder abduction, standing  Stand and hold a broomstick, a cane, or a similar object. Place your hands a little more than shoulder-width apart on the object. Your left / right hand should be palm-up, and your other hand should be palm-down. Keep your elbow straight and your shoulder muscles relaxed. Push the object across your body  toward your left / right side. Raise your left / right arm to the side of your body (abduction) until you feel a stretch in your shoulder. Do not raise your arm above shoulder height unless your health care provider tells you to do that. If directed, raise your arm over your head. Avoid shrugging your shoulder while you raise your arm. Keep your shoulder blade tucked down toward the middle of your back. Hold for __________ seconds. Slowly return to the starting position. Repeat __________ times. Complete this exercise __________ times a day. Internal rotation  Place your left / right hand behind your back, palm-up. Use your other hand to dangle an exercise band, a broomstick, or a similar object over your shoulder. Grasp the band with your left / right hand so you are holding on to both ends. Gently pull up on the band until you feel a stretch in the front of your left / right shoulder. The movement of your arm toward the center of your body is called internal rotation. Avoid shrugging your shoulder while you raise your arm. Keep your shoulder blade tucked down toward the middle of your back. Hold for __________ seconds. Release the stretch by letting go of the band and lowering your hands. Repeat __________ times. Complete this exercise __________ times a day. Strengthening exercises External rotation  Sit in a stable chair without armrests. Secure an exercise band to a stable object at elbow height on your left / right side. Place a soft object, such as a folded towel or a small pillow, between your left / right upper arm and your body to move your elbow about 4 inches (10 cm) away from your side. Hold the end of the exercise band so it is tight and there is no slack. Keeping your elbow pressed against the soft object, slowly move your forearm out, away from your abdomen (external rotation). Keep your body steady so only your forearm moves. Hold for __________ seconds. Slowly return to the  starting position. Repeat __________ times. Complete this exercise __________ times a day. Shoulder abduction  Sit in a stable chair without armrests, or stand up. Hold a __________ lb / kg  weight in your left / right hand, or hold an exercise band with both hands. Start with your arms straight down and your left / right palm facing in, toward your body. Slowly lift your left / right hand out to your side (abduction). Do not lift your hand above shoulder height unless your health care provider tells you that this is safe. Keep your arms straight. Avoid shrugging your shoulder while you do this movement. Keep your shoulder blade tucked down toward the middle of your back. Hold for __________ seconds. Slowly lower your arm, and return to the starting position. Repeat __________ times. Complete this exercise __________ times a day. Shoulder extension  Sit in a stable chair without armrests, or stand up. Secure an exercise band to a stable object in front of you so it is at shoulder height. Hold one end of the exercise band in each hand. Straighten your elbows and lift your hands up to shoulder height. Squeeze your shoulder blades together as you pull your hands down to the sides of your thighs (extension). Stop when your hands are straight down by your sides. Do not let your hands go behind your body. Hold for __________ seconds. Slowly return to the starting position. Repeat __________ times. Complete this exercise __________ times a day. Shoulder row  Sit in a stable chair without armrests, or stand up. Secure an exercise band to a stable object in front of you so it is at chest height. Hold one end of the exercise band in each hand. Position your palms so that your thumbs are facing the ceiling (neutral position). Bend each of your elbows to a 90-degree angle (right angle) and keep your upper arms at your sides. Step back or move the chair back until the band is tight and there is no  slack. Slowly pull your elbows back behind you. Hold for __________ seconds. Slowly return to the starting position. Repeat __________ times. Complete this exercise __________ times a day. Shoulder press-ups  Sit in a stable chair that has armrests. Sit upright, with your feet flat on the floor. Put your hands on the armrests so your elbows are bent and your fingers are pointing forward. Your hands should be about even with the sides of your body. Push down on the armrests and use your arms to lift yourself off the chair. Straighten your elbows and lift yourself up as much as you comfortably can. Move your shoulder blades down, and avoid letting your shoulders move up toward your ears. Keep your feet on the ground. As you get stronger, your feet should support less of your body weight as you lift yourself up. Hold for __________ seconds. Slowly lower yourself back into the chair. Repeat __________ times. Complete this exercise __________ times a day. Wall push-ups  Stand so you are facing a stable wall. Your feet should be about one arm-length away from the wall. Lean forward and place your palms on the wall at shoulder height. Keep your feet flat on the floor as you bend your elbows and lean forward toward the wall. Hold for __________ seconds. Straighten your elbows to push yourself back to the starting position. Repeat __________ times. Complete this exercise __________ times a day. This information is not intended to replace advice given to you by your health care provider. Make sure you discuss any questions you have with your health care provider. Document Revised: 09/22/2021 Document Reviewed: 09/22/2021 Elsevier Patient Education  2024 [employer].

## 2023-04-03 NOTE — Progress Notes (Signed)
Sed rate normal, uric acid normal, RF negative, anti-CCP negative.  I will discuss results at the follow-up visit.

## 2023-04-06 NOTE — Therapy (Signed)
OUTPATIENT OCCUPATIONAL THERAPY ORTHO EVALUATION  Patient Name: Damon Sanders MRN: [id number] DOB:1946-04-17, 77 y.o., male Today's Date: 04/07/2023  PCP: Emilio Aspen, MD REFERRING PROVIDER: Pollyann Savoy, MD  END OF SESSION:  OT End of Session - 04/07/23 1225     Visit Number 1    Number of Visits 7    Date for OT Re-Evaluation 05/20/23    Authorization Type [employer] MCR (Form submitted)    Progress Note Due on Visit 10    OT Start Time 0850    OT Stop Time 0930    OT Time Calculation (min) 40 min    Activity Tolerance Patient tolerated treatment well    Behavior During Therapy [facility] for tasks assessed/performed             Past Medical History:  Diagnosis Date   Hyperlipidemia    Hypertension    Past Surgical History:  Procedure Laterality Date   A-FLUTTER ABLATION N/A 09/15/2022   Procedure: A-FLUTTER ABLATION;  Surgeon: Marinus Maw, MD;  Location: [facility];  Service: Cardiovascular;  Laterality: N/A;   LEFT HEART CATH  01/11/2019   THYROIDECTOMY, PARTIAL  08/16/1997   Patient Active Problem List   Diagnosis Date Noted   Benign essential HTN 10/14/2022   Atrial flutter (HCC) 07/16/2022    ONSET DATE: 03/29/2023 (referral date)   REFERRING DIAG: M79.641,M79.642 (ICD-10-CM) - Bilateral hand pain M25.511,G89.29,M25.512 (ICD-10-CM) - Chronic pain of both shoulders  Note:  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  THERAPY DIAG:  Muscle weakness (generalized)  Other lack of coordination  Pain in left hand  Pain in right hand  Chronic left shoulder pain  Chronic right shoulder pain  Rationale for Evaluation and Treatment: Rehabilitation  SUBJECTIVE:   SUBJECTIVE STATEMENT: I recently got a cortisone injection Lt shoulder. I'm actually better now than I was 5 months ago - all of a sudden my Lt thumb started hurting and keeping me up at night, but it's better now. I did a bunch of yardwork the other day and  my shoulders were ok too.  Pt accompanied by: self  PERTINENT HISTORY: Other medical problems are listed as follows:   Typical atrial flutter (HCC)  Benign essential HTN Coronary artery disease  History of hyperlipidemia  History of glaucoma  PRECAUTIONS: None  RED FLAGS: None   WEIGHT BEARING RESTRICTIONS: No  PAIN:  Are you having pain?  No pain at rest,  Lt shoulder still bothers him at times however hand pain has gotten better   FALLS: Has patient fallen in last 6 months? No  LIVING ENVIRONMENT: Lives with: lives with their spouse Lives in: House/apartment - 2 story home, 2 steps to enter Has following equipment at home: None  PLOF: Independent and retired Personnel officer, Programmer, applications  PATIENT GOALS: work on my hands to stay limber and strong, maybe shoulder   NEXT MD VISIT: Sept 2024  OBJECTIVE:   X-rays showed bilateral CMC PIP and DIP narrowing consistent with osteoarthritis. No erosive changes were noted. X-rays of bilateral shoulder joints were unremarkable.  HAND DOMINANCE: Left  ADLs:  Eating: independent, difficulty cutting waffles using fork d/t pressure needed however this has gotten better Grooming: independent UB Dressing: independent LB Dressing: independent Toileting: independent Bathing: independent Tub Shower transfers: independent Equipment: none   UPPER EXTREMITY ROM:   BUE AROM WFL's, slightly limited thumb MP flex on Lt side with pain/cramping trying to come back into neutral position   UPPER EXTREMITY MMT:  RUE grossly 4+/5, LUE grossly 5/5   HAND FUNCTION: Grip strength: Right: 56.8 lbs; Left: 50.4 lbs, Lateral pinch: Right: 17 lbs, Left: 13 lbs, and 3 point pinch: Right: 12 lbs, Left: 12 lbs  COORDINATION: 9 Hole Peg test: Right: 27 sec; Left: 32 sec  SENSATION: WFL  EDEMA: none except slight joint swelling in hands  COGNITION: Overall cognitive status: Within functional limits for tasks assessed   OBSERVATIONS: Pt has  improved since 5 months ago but will benefit from O.T. to work on joint protection and strength Lt hand   TODAY'S TREATMENT:                                                                                                                               N/A today  PATIENT EDUCATION: Education details: OT POC Person educated: Patient Education method: Explanation Education comprehension: verbalized understanding  HOME EXERCISE PROGRAM: N/A  GOALS: Goals reviewed with patient? Yes  SHORT TERM GOALS: Target date: 05/01/23  Pt to verbalize understanding with joint protection strategies, task modifications, modalities (paraffin, heat) and A/E that will prevent further joint damage and help manage pain Baseline: Goal status: INITIAL  2.  Pt to verbalize understanding of potential future splinting/orthotic needs for Lt thumb Baseline:  Goal status: INITIAL  3.  Pt to be independent with HEP for Lt grip and pinch strength and bilateral shoulder ROM Baseline:  Goal status: INITIAL   LONG TERM GOALS: Target date: 05/20/23  Pt to improve Lt dominant hand grip strength to 55 lbs or greater Baseline: 50 lbs Goal status: INITIAL  2.  Pt to improve Lt lateral pinch by 2 lbs or greater Baseline: 13 Goal status: INITIAL  3.  Pt to improve Lt hand coordination as evidenced by performing 9 hole peg test in under 30 sec Baseline: 32 sec Goal status: INITIAL   ASSESSMENT:  CLINICAL IMPRESSION: Patient is a 77 y.o. male who was seen today for occupational therapy evaluation for bilateral hand pain and chronic pain of bilateral shoulders. Pt reports pain has gotten much better in hands however pain would keep him up at night 5 months ago (especially Lt thumb). Pt also w/ recent cortisone injection to Lt shoulder which has helped somewhat with pain. Pt reports no pain in shoulders today but certain times are more aggravating. Pt also with slightly weaker grip and pinch strength Lt dominant  hand and slightly decreased coordination Lt dominant hand. Pt would benefit from O.T. to address these deficits and be provided education on joint protection, task modifications, A/E and splinting needs.   PERFORMANCE DEFICITS: in functional skills including IADLs, coordination, ROM, strength, pain, Fine motor control, body mechanics, decreased knowledge of use of DME, and UE functional use.   IMPAIRMENTS: are limiting patient from IADLs and leisure.   COMORBIDITIES: may have co-morbidities  that affects occupational performance. Patient will benefit from skilled OT to address above impairments and improve overall function.  MODIFICATION  OR ASSISTANCE TO COMPLETE EVALUATION: No modification of tasks or assist necessary to complete an evaluation.  OT OCCUPATIONAL PROFILE AND HISTORY: Problem focused assessment: Including review of records relating to presenting problem.  CLINICAL DECISION MAKING: Moderate - several treatment options, min-mod task modification necessary  REHAB POTENTIAL: Good  EVALUATION COMPLEXITY: Low      PLAN:  OT FREQUENCY: 1x/week  OT DURATION: 6 weeks (plus eval)   PLANNED INTERVENTIONS: self care/ADL training, therapeutic exercise, therapeutic activity, neuromuscular re-education, manual therapy, passive range of motion, paraffin, fluidotherapy, moist heat, patient/family education, and DME and/or AE instructions  RECOMMENDED OTHER SERVICES: none at this time  CONSULTED AND AGREED WITH PLAN OF CARE: Patient  PLAN FOR NEXT SESSION: paraffin, joint protection, task modifications, A/E recommendations   Sheran Lawless, OT 04/07/2023, 12:29 PM

## 2023-04-07 ENCOUNTER — Ambulatory Visit: Payer: Medicare HMO | Attending: Rheumatology | Admitting: Occupational Therapy

## 2023-04-07 ENCOUNTER — Encounter: Payer: Self-pay | Admitting: Occupational Therapy

## 2023-04-07 DIAGNOSIS — M25512 Pain in left shoulder: Secondary | ICD-10-CM | POA: Insufficient documentation

## 2023-04-07 DIAGNOSIS — G8929 Other chronic pain: Secondary | ICD-10-CM | POA: Diagnosis not present

## 2023-04-07 DIAGNOSIS — M6281 Muscle weakness (generalized): Secondary | ICD-10-CM | POA: Diagnosis not present

## 2023-04-07 DIAGNOSIS — M79641 Pain in right hand: Secondary | ICD-10-CM | POA: Diagnosis not present

## 2023-04-07 DIAGNOSIS — R278 Other lack of coordination: Secondary | ICD-10-CM | POA: Diagnosis not present

## 2023-04-07 DIAGNOSIS — M25511 Pain in right shoulder: Secondary | ICD-10-CM | POA: Diagnosis not present

## 2023-04-07 DIAGNOSIS — M79642 Pain in left hand: Secondary | ICD-10-CM | POA: Diagnosis not present

## 2023-04-12 ENCOUNTER — Ambulatory Visit: Payer: Medicare HMO | Admitting: Occupational Therapy

## 2023-04-12 ENCOUNTER — Encounter: Payer: Self-pay | Admitting: Occupational Therapy

## 2023-04-12 DIAGNOSIS — R278 Other lack of coordination: Secondary | ICD-10-CM

## 2023-04-12 DIAGNOSIS — M25511 Pain in right shoulder: Secondary | ICD-10-CM | POA: Diagnosis not present

## 2023-04-12 DIAGNOSIS — M79641 Pain in right hand: Secondary | ICD-10-CM

## 2023-04-12 DIAGNOSIS — G8929 Other chronic pain: Secondary | ICD-10-CM | POA: Diagnosis not present

## 2023-04-12 DIAGNOSIS — M6281 Muscle weakness (generalized): Secondary | ICD-10-CM

## 2023-04-12 DIAGNOSIS — M79642 Pain in left hand: Secondary | ICD-10-CM | POA: Diagnosis not present

## 2023-04-12 DIAGNOSIS — M25512 Pain in left shoulder: Secondary | ICD-10-CM | POA: Diagnosis not present

## 2023-04-12 NOTE — Therapy (Signed)
OUTPATIENT OCCUPATIONAL THERAPY ORTHO TREATMENT  Patient Name: Damon Sanders MRN: [id number] DOB:[birth year], 77 y.o., male Today's Date: 04/12/2023  PCP: Emilio Aspen, MD REFERRING PROVIDER: Pollyann Savoy, MD  END OF SESSION:  OT End of Session - 04/12/23 [phone]     Visit Number 2    Number of Visits 7    Date for OT Re-Evaluation 05/20/23    Authorization Type [employer] M[employer] (Form submitted)    Progress Note Due on Visit 10    OT Start Time 0805    OT Stop Time 0850    OT Time Calculation (min) 45 min    Activity Tolerance Patient tolerated treatment well    Behavior During Therapy [facility] for tasks assessed/performed             Past Medical History:  Diagnosis Date   Hyperlipidemia    Hypertension    Past Surgical History:  Procedure Laterality Date   A-FLUTTER ABLATION N/A 09/15/2022   Procedure: A-FLUTTER ABLATION;  Surgeon: Marinus Maw, MD;  Location: [facility];  Service: Cardiovascular;  Laterality: N/A;   LEFT HEART CATH  01/11/2019   THYROIDECTOMY, PARTIAL  08/16/1997   Patient Active Problem List   Diagnosis Date Noted   Benign essential HTN 10/14/2022   Atrial flutter (HCC) 07/16/2022    ONSET DATE: 03/29/2023 (referral date)   REFERRING DIAG: M79.641,M79.642 (ICD-10-CM) - Bilateral hand pain M25.511,G89.29,M25.512 (ICD-10-CM) - Chronic pain of both shoulders  Note:  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  THERAPY DIAG:  Pain in left hand  Pain in right hand  Other lack of coordination  Muscle weakness (generalized)  Rationale for Evaluation and Treatment: Rehabilitation  SUBJECTIVE:   SUBJECTIVE STATEMENT: No pain today, even in my Lt thumb  Pt accompanied by: self  PERTINENT HISTORY: Other medical problems are listed as follows:   Typical atrial flutter (HCC)  Benign essential HTN Coronary artery disease  History of hyperlipidemia  History of glaucoma  PRECAUTIONS: None  RED  FLAGS: None   WEIGHT BEARING RESTRICTIONS: No  PAIN:  Are you having pain?  No pain at rest,  Lt shoulder still bothers him at times however hand pain has gotten better   FALLS: Has patient fallen in last 6 months? No  LIVING ENVIRONMENT: Lives with: lives with their spouse Lives in: House/apartment - 2 story home, 2 steps to enter Has following equipment at home: None  PLOF: Independent and retired Personnel officer, Programmer, applications  PATIENT GOALS: work on my hands to stay limber and strong, maybe shoulder   NEXT MD VISIT: Sept 2024  OBJECTIVE:   X-rays showed bilateral CMC PIP and DIP narrowing consistent with osteoarthritis. No erosive changes were noted. X-rays of bilateral shoulder joints were unremarkable.  HAND DOMINANCE: Left  ADLs:  Eating: independent, difficulty cutting waffles using fork d/t pressure needed however this has gotten better Grooming: independent UB Dressing: independent LB Dressing: independent Toileting: independent Bathing: independent Tub Shower transfers: independent Equipment: none   UPPER EXTREMITY ROM:   BUE AROM WFL's, slightly limited thumb MP flex on Lt side with pain/cramping trying to come back into neutral position   UPPER EXTREMITY MMT:   RUE grossly 4+/5, LUE grossly 5/5   HAND FUNCTION: Grip strength: Right: 56.8 lbs; Left: 50.4 lbs, Lateral pinch: Right: 17 lbs, Left: 13 lbs, and 3 point pinch: Right: 12 lbs, Left: 12 lbs  COORDINATION: 9 Hole Peg test: Right: 27 sec; Left: 32 sec  SENSATION: WFL  EDEMA: none  except slight joint swelling in hands  COGNITION: Overall cognitive status: Within functional limits for tasks assessed   OBSERVATIONS: Pt has improved since 5 months ago but will benefit from O.T. to work on joint protection and strength Lt hand   TODAY'S TREATMENT:                                                                                                                               Paraffin x 10 min to  decrease stiffness in joints while simultaneously providing education on joint protection strategies, task modifications, and A/E. Reviewed extensively joint protection and task modifications  Pt shown alternative pens and he liked Pen Again. Pt provided with handouts on A/E: jar openers, electric can openers, ergonomic knives, spring loaded ergonomic scissors, key and doorknob attachments, adapted pen  PATIENT EDUCATION: Education details: joint protection strategies, task modifications, and A/E recommendations Person educated: Patient Education method: Explanation, Demonstration, Verbal cues, and Handouts Education comprehension: verbalized understanding  HOME EXERCISE PROGRAM: N/A  GOALS: Goals reviewed with patient? Yes  SHORT TERM GOALS: Target date: 05/01/23  Pt to verbalize understanding with joint protection strategies, task modifications, modalities (paraffin, heat) and A/E that will prevent further joint damage and help manage pain Baseline: Goal status: IN PROGRESS  2.  Pt to verbalize understanding of potential future splinting/orthotic needs for Lt thumb Baseline:  Goal status: INITIAL  3.  Pt to be independent with HEP for Lt grip and pinch strength and bilateral shoulder ROM Baseline:  Goal status: INITIAL   LONG TERM GOALS: Target date: 05/20/23  Pt to improve Lt dominant hand grip strength to 55 lbs or greater Baseline: 50 lbs Goal status: INITIAL  2.  Pt to improve Lt lateral pinch by 2 lbs or greater Baseline: 13 Goal status: INITIAL  3.  Pt to improve Lt hand coordination as evidenced by performing 9 hole peg test in under 30 sec Baseline: 32 sec Goal status: INITIAL   ASSESSMENT:  CLINICAL IMPRESSION:  Pt reports pain has gotten much better in hands however pain would keep him up at night 5 months ago (especially Lt thumb). Pt also w/ recent cortisone injection to Lt shoulder which has helped somewhat with pain. Pt reports no pain today but certain  times are more aggravating. Pt also with slightly weaker grip and pinch strength Lt dominant hand and slightly decreased coordination Lt dominant hand. Pt would benefit from continued O.T. to address these deficits and continue education on joint protection, task modifications, A/E and splinting needs.   PERFORMANCE DEFICITS: in functional skills including IADLs, coordination, ROM, strength, pain, Fine motor control, body mechanics, decreased knowledge of use of DME, and UE functional use.   IMPAIRMENTS: are limiting patient from IADLs and leisure.   COMORBIDITIES: may have co-morbidities  that affects occupational performance. Patient will benefit from skilled OT to address above impairments and improve overall function.  MODIFICATION OR ASSISTANCE TO COMPLETE EVALUATION: No modification of  tasks or assist necessary to complete an evaluation.  OT OCCUPATIONAL PROFILE AND HISTORY: Problem focused assessment: Including review of records relating to presenting problem.  CLINICAL DECISION MAKING: Moderate - several treatment options, min-mod task modification necessary  REHAB POTENTIAL: Good  EVALUATION COMPLEXITY: Low      PLAN:  OT FREQUENCY: 1x/week  OT DURATION: 6 weeks (plus eval)   PLANNED INTERVENTIONS: self care/ADL training, therapeutic exercise, therapeutic activity, neuromuscular re-education, manual therapy, passive range of motion, paraffin, fluidotherapy, moist heat, patient/family education, and DME and/or AE instructions  RECOMMENDED OTHER SERVICES: none at this time  CONSULTED AND AGREED WITH PLAN OF CARE: Patient  PLAN FOR NEXT SESSION: fluidotherapy, issue thumb and wrist A/ROM HEP, sh ROM HEP    Sheran Lawless, OT 04/12/2023, 9:18 AM

## 2023-04-12 NOTE — Patient Instructions (Signed)
Joint Protection Respect pain/Develop pain awareness Monitor activities - stop to rest when discomfort or fatigue develops Awareness of pain for activities prior to 12-24 hrs Pain lingering 2 hrs after activity is a warning sign - modify or eliminate Use larger joints and muscles Protect the delicate joints by using larger joints - use shoulder to carry bags not hands More proximal muscles and joints to perform ADL's (forearm/shoulder to open door-protect fingers) Groceries in paper bags Let the hip, elbow and arm support the grocery bag/push cart's weight Push up from seat with palms of hands, not back of fingers Avoid tight/strong and prolonged grasp Build up handles (foam or cloth) on utensils, tools and pens Modify handles with levers Use AE for assistance for opening jars, pens and books, turning knobs and keys Change grasp to avoid static positioning Work with fingers extended over a large sponge rather than squeezing it Use open, flat palm to open jars, pump bottles for shampoo/soaps Avoid carrying, lifting and using heavy objects Use a cart to move heavy objects Push heavy objects with hips instead of pulling with hands Distribute weight over many joints - use 2 hands to lift an item Use light weight tools and objects Use smaller containers of liquid or distribute liquids to smaller containers to lighten loads Balance rest and activity Plan rest breaks ahead of time Avoid activities that cannot be stopped if needed Energy conservation techniques (sitting when able, planning ahead, organizing workspace and storage for accessibility, keeping most common used items within easy access, resting during activities, use timesavers like prepared foods) AE-Labor saving devices (modified cooking writing or cutting utensils, zipper pulls, button hooks, built up handle items, large grip pens, jar openers, key holder/turner, garden tools, and spring loaded scissors) Use splints as prescribes  to protect joints

## 2023-04-12 NOTE — Progress Notes (Signed)
Office Visit Note  Patient: Damon Sanders             Date of Birth: Aug 16, 1946           MRN: [id number]             PCP: Emilio Aspen, MD Referring: Emilio Aspen, * Visit Date: 04/26/2023 Occupation: @GUAROCC @  Subjective:  Shoulder pain  History of Present Illness: DAMON GERSH is a 77 y.o. male with osteoarthritis.  He states he had a very good response to left shoulder joint injection and the pain has resolved.  He is not having much discomfort in his right shoulder.  He has occasional stiffness in his hands which has resolved.  He denies any discomfort in any other joints.  He states he tried getting orthotics for his shoes they were expensive.  He plans to see his podiatrist.  She is scheduled for nerve conduction velocity test by his PCP.  He has not noticed any joint swelling since the last visit.  Patient states he had an episode of dizziness since the last visit which lasted for about 3 days and then resolved.  He has an appointment coming up with his PCP.    Activities of Daily Living:  Patient reports morning stiffness for 0 minute.   Patient Denies nocturnal pain.  Difficulty dressing/grooming: Denies Difficulty climbing stairs: Denies Difficulty getting out of chair: Denies Difficulty using hands for taps, buttons, cutlery, and/or writing: Denies  Review of Systems  Constitutional:  Negative for fatigue.  HENT:  Negative for mouth sores and mouth dryness.   Eyes:  Negative for dryness.  Respiratory:  Negative for shortness of breath.   Cardiovascular:  Negative for chest pain and palpitations.  Gastrointestinal:  Negative for blood in stool, constipation and diarrhea.  Endocrine: Negative for increased urination.  Genitourinary:  Negative for decreased urine output.  Musculoskeletal:  Negative for joint pain, gait problem, joint pain, joint swelling, myalgias, morning stiffness and myalgias.  Skin:  Negative for color change, rash and  sensitivity to sunlight.  Allergic/Immunologic: Negative for susceptible to infections.  Neurological:  Positive for dizziness. Negative for headaches.  Hematological:  Negative for swollen glands.  Psychiatric/Behavioral:  Negative for depressed mood and sleep disturbance. The patient is not nervous/anxious.     PMFS History:  Patient Active Problem List   Diagnosis Date Noted   Benign essential HTN 10/14/2022   Atrial flutter (HCC) 07/16/2022    Past Medical History:  Diagnosis Date   Hyperlipidemia    Hypertension     Family History  Problem Relation Age of Onset   Colon cancer Father    Esophageal cancer Neg Hx    Rectal cancer Neg Hx    Stomach cancer Neg Hx    Past Surgical History:  Procedure Laterality Date   A-FLUTTER ABLATION N/A 09/15/2022   Procedure: A-FLUTTER ABLATION;  Surgeon: Marinus Maw, MD;  Location: [facility];  Service: Cardiovascular;  Laterality: N/A;   LEFT HEART CATH  01/11/2019   THYROIDECTOMY, PARTIAL  08/16/1997   Social History   Social History Narrative   Not on file   Immunization History  Administered Date(s) Administered   PFIZER(Purple Top)SARS-COV-2 Vaccination 09/30/2019, 10/23/2019, 07/19/2020     Objective: Vital Signs: BP 127/72 (BP Location: Left Arm, Patient Position: Sitting, Cuff Size: Normal)   Pulse (!) 56   Resp 16   Ht 5\' 7"  (1.702 m)   Wt 164 lb (74.4 kg)  BMI 25.69 kg/m    Physical Exam Vitals and nursing note reviewed.  Constitutional:      Appearance: He is well-developed.  HENT:     Head: Normocephalic and atraumatic.  Eyes:     Conjunctiva/sclera: Conjunctivae normal.     Pupils: Pupils are equal, round, and reactive to light.  Cardiovascular:     Rate and Rhythm: Normal rate and regular rhythm.     Heart sounds: Normal heart sounds.  Pulmonary:     Effort: Pulmonary effort is normal.     Breath sounds: Normal breath sounds.  Abdominal:     General: Bowel sounds are normal.      Palpations: Abdomen is soft.  Musculoskeletal:     Cervical back: Normal range of motion and neck supple.  Skin:    General: Skin is warm and dry.     Capillary Refill: Capillary refill takes less than 2 seconds.  Neurological:     Mental Status: He is alert and oriented to person, place, and time.  Psychiatric:        Behavior: Behavior normal.      Musculoskeletal Exam: Cervical, thoracic and lumbar spine were in good range of motion.  Shoulder joints, elbow joints, wrist joints, MCPs PIPs and DIPs were in good range of motion.  He had bilateral PIP and DIP thickening with no synovitis.  Hip joints and knee joints were in good range of motion.  He had no tenderness over ankles or MTPs.  CDAI Exam: CDAI Score: -- Patient Global: --; Provider Global: -- Swollen: --; Tender: -- Joint Exam 04/26/2023   No joint exam has been documented for this visit   There is currently no information documented on the homunculus. Go to the Rheumatology activity and complete the homunculus joint exam.  Investigation: No additional findings.  Imaging: XR Hand 2 View Left  Result Date: 03/29/2023 Severe CMC narrowing and subluxation with spurring was noted.  Narrowing of first, second and third MCP joints was noted.  PIP and DIP narrowing was noted.  No intercarpal radiocarpal joint space narrowing was noted.  No erosive changes were noted. Impression: These findings are suggestive of osteoarthritis of the hand.  XR Hand 2 View Right  Result Date: 03/29/2023 Severe CMC, PIP and DIP narrowing was noted.  Spurring of first PIP joint was noted.  Narrowing of  third MCP joint was noted.  No intercarpal radiocarpal joint space narrowing was noted.  No erosive changes were noted. Impression: These findings are suggestive of osteoarthritis of the hand.  XR Shoulder Left  Result Date: 03/29/2023 No glenohumeral or acromioclavicular joint space narrowing was noted.  No chondrocalcinosis was noted.  Impression: Unremarkable x-rays of the shoulder.  XR Shoulder Right  Result Date: 03/29/2023 No glenohumeral or acromioclavicular joint space narrowing was noted.  No chondrocalcinosis was noted. Impression: Unremarkable x-rays of the shoulder.   Recent Labs: Lab Results  Component Value Date   WBC 8.4 10/11/2022   HGB 11.8 (L) 10/11/2022   PLT 206 10/11/2022   NA 142 08/23/2022   K 3.6 08/23/2022   CL 106 08/23/2022   CO2 25 08/23/2022   GLUCOSE 120 (H) 08/23/2022   BUN 13 08/23/2022   CREATININE 0.98 08/23/2022   CALCIUM 9.3 08/23/2022   GFRAA  03/15/2007    >60        The eGFR has been calculated using the MDRD equation. This calculation has not been validated in all clinical   March 29 2023 ESR 2,  uric acid 4.8, RF negative, anti-CCP negative  Speciality Comments: No specialty comments available.  Procedures:  No procedures performed Allergies: Caine-1 [lidocaine], Penicillins, Procaine, and Lisinopril   Assessment / Plan:     Visit Diagnoses: Primary osteoarthritis of both hands -he complains of intermittent pain and discomfort in his hands.  He denies any discomfort today.  PIP and DIP thickening with no synovitis was noted.  X-rays of bilateral hands were consistent with osteoarthritis.  X-ray findings were reviewed with the patient.  RF negative, anti-CCP negative, uric acid in the desirable range.  Sed rate was normal.  Lab results were reviewed with the patient.  Joint protection muscle strengthening were discussed.  He states he has been doing exercises.  Bilateral carpal tunnel syndrome - Status post bilateral carpal tunnel injections by Dr. Mina Marble in the past.  Patient states that he is scheduled for nerve conduction velocity.  Chronic pain of both shoulders -he had good response to left shoulder joint injection performed on March 29, 2023.  He had good range of motion of his shoulder joints today.  He was also given a handout on exercises.  He has been doing  stretching exercises.  X-rays of bilateral shoulders were unremarkable.  Pes cavus of both feet - arch support was advised.  Patient plans to see his podiatrist for arch support.  He states the arch support at [employer] were very expensive.  Hammertoes of both feet-he will benefit from arch support.  Benign essential HTN-blood pressure was 127/72.  Other medical problems are listed as follows:  Coronary artery disease involving native coronary artery of native heart with other form of angina pectoris (HCC)  Typical atrial flutter (HCC)  History of hyperlipidemia  History of glaucoma  Orders: No orders of the defined types were placed in this encounter.  No orders of the defined types were placed in this encounter.   Follow-Up Instructions: Return in about 1 year (around 04/25/2024) for Osteoarthritis.   Pollyann Savoy, MD  Note - This record has been created using Animal nutritionist.  Chart creation errors have been sought, but may not always  have been located. Such creation errors do not reflect on  the standard of medical care.

## 2023-04-19 DIAGNOSIS — R202 Paresthesia of skin: Secondary | ICD-10-CM | POA: Diagnosis not present

## 2023-04-20 ENCOUNTER — Ambulatory Visit: Payer: Medicare HMO | Admitting: Occupational Therapy

## 2023-04-25 ENCOUNTER — Ambulatory Visit: Payer: Medicare HMO | Admitting: Occupational Therapy

## 2023-04-26 ENCOUNTER — Encounter: Payer: Self-pay | Admitting: Rheumatology

## 2023-04-26 ENCOUNTER — Ambulatory Visit: Payer: Medicare HMO | Attending: Rheumatology | Admitting: Rheumatology

## 2023-04-26 VITALS — BP 127/72 | HR 56 | Resp 16 | Ht 67.0 in | Wt 164.0 lb

## 2023-04-26 DIAGNOSIS — M19041 Primary osteoarthritis, right hand: Secondary | ICD-10-CM | POA: Diagnosis not present

## 2023-04-26 DIAGNOSIS — M2041 Other hammer toe(s) (acquired), right foot: Secondary | ICD-10-CM

## 2023-04-26 DIAGNOSIS — Z8639 Personal history of other endocrine, nutritional and metabolic disease: Secondary | ICD-10-CM

## 2023-04-26 DIAGNOSIS — I25118 Atherosclerotic heart disease of native coronary artery with other forms of angina pectoris: Secondary | ICD-10-CM | POA: Diagnosis not present

## 2023-04-26 DIAGNOSIS — M2042 Other hammer toe(s) (acquired), left foot: Secondary | ICD-10-CM

## 2023-04-26 DIAGNOSIS — G5603 Carpal tunnel syndrome, bilateral upper limbs: Secondary | ICD-10-CM

## 2023-04-26 DIAGNOSIS — G8929 Other chronic pain: Secondary | ICD-10-CM

## 2023-04-26 DIAGNOSIS — Z8669 Personal history of other diseases of the nervous system and sense organs: Secondary | ICD-10-CM

## 2023-04-26 DIAGNOSIS — I10 Essential (primary) hypertension: Secondary | ICD-10-CM | POA: Diagnosis not present

## 2023-04-26 DIAGNOSIS — Q6672 Congenital pes cavus, left foot: Secondary | ICD-10-CM

## 2023-04-26 DIAGNOSIS — M25512 Pain in left shoulder: Secondary | ICD-10-CM

## 2023-04-26 DIAGNOSIS — Q6671 Congenital pes cavus, right foot: Secondary | ICD-10-CM

## 2023-04-26 DIAGNOSIS — M25511 Pain in right shoulder: Secondary | ICD-10-CM | POA: Diagnosis not present

## 2023-04-26 DIAGNOSIS — I483 Typical atrial flutter: Secondary | ICD-10-CM

## 2023-04-26 DIAGNOSIS — M19042 Primary osteoarthritis, left hand: Secondary | ICD-10-CM

## 2023-05-02 ENCOUNTER — Ambulatory Visit: Payer: Medicare HMO | Attending: Rheumatology | Admitting: Occupational Therapy

## 2023-05-02 ENCOUNTER — Encounter: Payer: Self-pay | Admitting: Occupational Therapy

## 2023-05-02 DIAGNOSIS — R278 Other lack of coordination: Secondary | ICD-10-CM | POA: Insufficient documentation

## 2023-05-02 DIAGNOSIS — M79641 Pain in right hand: Secondary | ICD-10-CM | POA: Diagnosis not present

## 2023-05-02 DIAGNOSIS — G629 Polyneuropathy, unspecified: Secondary | ICD-10-CM | POA: Diagnosis not present

## 2023-05-02 DIAGNOSIS — M6281 Muscle weakness (generalized): Secondary | ICD-10-CM | POA: Insufficient documentation

## 2023-05-02 DIAGNOSIS — M79642 Pain in left hand: Secondary | ICD-10-CM | POA: Diagnosis not present

## 2023-05-02 NOTE — Patient Instructions (Signed)
AROM: Wrist Extension    With both palms down, bend wrist up and down slowly. Repeat _10___ times per set.  Do __2-3__ sessions per day.  Opposition (Active)   Touch tip of thumb to nail tip of each finger in turn, making an "O" shape. Repeat __10__ times. Do _2-3__ sessions per day.   AROM: Thumb Abduction / Adduction    Actively pull both thumbs away from/in front of palm.  Then bring thumb back to touch fingers. Try not to bend fingers toward thumb. Repeat _10___ times per set.  Do __2-3__ sessions per day.    MP Flexion (Active)   Bend thumb to touch base of little finger, keeping tip joint straight. Repeat __10__ times. Do _2-3___ sessions per day    Composite Extension (Active)   Bring thumb up and out in hitchhiker position.  Repeat __10__ times. Do _2-3___ sessions per day. Do NOT hyperextend tip joint   IP Flexion (Active Blocked)   Brace thumb below tip joint. Bend joint as far as possible. Repeat __10__ times. Do _2-3___ sessions per day.   SHOULDER: Flexion - Sitting    Hold cane with both hands. Raise arms up. Keep elbows straight. Hold __3_ seconds. 10___ reps per set, _2__ sets per day   ROM: Abduction - Wand    Holding wand with left hand palm up, push wand directly out to side, leading with other hand palm down, until stretch is felt.  Repeat _20___ times per set.  Do __2__ sessions per day.   Cane Exercise: Extension    Stand holding cane behind back with both palms facing forward. Lift the cane away from body 2-4". Hold __3__ seconds. Repeat __10__ times. Do _2___ sessions per day.

## 2023-05-02 NOTE — Therapy (Signed)
OUTPATIENT OCCUPATIONAL THERAPY ORTHO TREATMENT  Patient Name: Damon Sanders MRN: [id number] DOB:1946-07-06, 77 y.o., male Today's Date: 05/02/2023  PCP: Emilio Aspen, MD REFERRING PROVIDER: Pollyann Savoy, MD  END OF SESSION:  OT End of Session - 05/02/23 0856     Visit Number 3    Number of Visits 7    Date for OT Re-Evaluation 05/20/23    Authorization Type [employer] MCR (Form submitted)    Progress Note Due on Visit 10    OT Start Time 0845    OT Stop Time 0930    OT Time Calculation (min) 45 min    Activity Tolerance Patient tolerated treatment well    Behavior During Therapy [facility] for tasks assessed/performed             Past Medical History:  Diagnosis Date   Hyperlipidemia    Hypertension    Past Surgical History:  Procedure Laterality Date   A-FLUTTER ABLATION N/A 09/15/2022   Procedure: A-FLUTTER ABLATION;  Surgeon: Marinus Maw, MD;  Location: [facility];  Service: Cardiovascular;  Laterality: N/A;   LEFT HEART CATH  01/11/2019   THYROIDECTOMY, PARTIAL  08/16/1997   Patient Active Problem List   Diagnosis Date Noted   Benign essential HTN 10/14/2022   Atrial flutter (HCC) 07/16/2022    ONSET DATE: 03/29/2023 (referral date)   REFERRING DIAG: M79.641,M79.642 (ICD-10-CM) - Bilateral hand pain M25.511,G89.29,M25.512 (ICD-10-CM) - Chronic pain of both shoulders  Note:  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  THERAPY DIAG:  Pain in left hand  Pain in right hand  Other lack of coordination  Muscle weakness (generalized)  Rationale for Evaluation and Treatment: Rehabilitation  SUBJECTIVE:   SUBJECTIVE STATEMENT: No pain today, even in my Lt thumb  Pt accompanied by: self  PERTINENT HISTORY: Other medical problems are listed as follows:   Typical atrial flutter (HCC)  Benign essential HTN Coronary artery disease  History of hyperlipidemia  History of glaucoma  PRECAUTIONS: None  RED  FLAGS: None   WEIGHT BEARING RESTRICTIONS: No  PAIN:  Are you having pain?  No pain at rest,  Lt shoulder still bothers him at times however hand pain has gotten better   FALLS: Has patient fallen in last 6 months? No  LIVING ENVIRONMENT: Lives with: lives with their spouse Lives in: House/apartment - 2 story home, 2 steps to enter Has following equipment at home: None  PLOF: Independent and retired Personnel officer, Programmer, applications  PATIENT GOALS: work on my hands to stay limber and strong, maybe shoulder   NEXT MD VISIT: Sept 2024  OBJECTIVE:   X-rays showed bilateral CMC PIP and DIP narrowing consistent with osteoarthritis. No erosive changes were noted. X-rays of bilateral shoulder joints were unremarkable.  HAND DOMINANCE: Left  ADLs:  Eating: independent, difficulty cutting waffles using fork d/t pressure needed however this has gotten better Grooming: independent UB Dressing: independent LB Dressing: independent Toileting: independent Bathing: independent Tub Shower transfers: independent Equipment: none   UPPER EXTREMITY ROM:   BUE AROM WFL's, slightly limited thumb MP flex on Lt side with pain/cramping trying to come back into neutral position   UPPER EXTREMITY MMT:   RUE grossly 4+/5, LUE grossly 5/5   HAND FUNCTION: Grip strength: Right: 56.8 lbs; Left: 50.4 lbs, Lateral pinch: Right: 17 lbs, Left: 13 lbs, and 3 point pinch: Right: 12 lbs, Left: 12 lbs  COORDINATION: 9 Hole Peg test: Right: 27 sec; Left: 32 sec  SENSATION: WFL  EDEMA: none  except slight joint swelling in hands  COGNITION: Overall cognitive status: Within functional limits for tasks assessed   OBSERVATIONS: Pt has improved since 5 months ago but will benefit from O.T. to work on joint protection and strength Lt hand   TODAY'S TREATMENT:                                                                                                                               Fluidotherapy x 12 minutes  for both hands to address stiffness. No adverse reactions.   Reviewed previous recommendations from last session briefly.   Issued HEP for wrist and thumb, as well as shoulder - see pt instructions for details  Assessed pt's current pre-fab splints, however 2 of 3 splints were just wrist cock up splints which would not support thumb. The 3rd splint was a thumb spica splint however therapist felt this was too bulky considering his previous pain was really only the IP joint of thumb - pt shown another pre-fab option that would support just the thumb and minimally at wrist w/ less bulk and more comfortable for patient. Pt does not currently need any splint secondary to no  pain or current concerns, however this may be a good option for future flare ups of thumb pain.      PATIENT EDUCATION: Education details: HEP for shoulder, wrist, and thumb Person educated: Patient Education method: Explanation, Demonstration, Verbal cues, and Handouts Education comprehension: verbalized understanding  HOME EXERCISE PROGRAM: 05/02/23: HEP for shoulder, wrist, and thumb  GOALS: Goals reviewed with patient? Yes  SHORT TERM GOALS: Target date: 05/01/23  Pt to verbalize understanding with joint protection strategies, task modifications, modalities (paraffin, heat) and A/E that will prevent further joint damage and help manage pain Baseline: Goal status: MET  2.  Pt to verbalize understanding of potential future splinting/orthotic needs for Lt thumb Baseline:  Goal status: MET  3.  Pt to be independent with HEP for Lt grip and pinch strength and bilateral shoulder ROM Baseline:  Goal status: IN PROGRESS (for shoulder HEP)   LONG TERM GOALS: Target date: 05/20/23  Pt to improve Lt dominant hand grip strength to 55 lbs or greater Baseline: 50 lbs Goal status: INITIAL  2.  Pt to improve Lt lateral pinch by 2 lbs or greater Baseline: 13 Goal status: INITIAL  3.  Pt to improve Lt hand coordination  as evidenced by performing 9 hole peg test in under 30 sec Baseline: 32 sec Goal status: INITIAL   ASSESSMENT:  CLINICAL IMPRESSION:  Pt reports pain has gotten much better in hands however pain would keep him up at night 5 months ago (especially Lt thumb). Pt progressing towards STG's and really has had no pain recent visits. Pt with greater understanding of joint protection techniques   PERFORMANCE DEFICITS: in functional skills including IADLs, coordination, ROM, strength, pain, Fine motor control, body mechanics, decreased knowledge of use of DME, and UE functional use.  IMPAIRMENTS: are limiting patient from IADLs and leisure.   COMORBIDITIES: may have co-morbidities  that affects occupational performance. Patient will benefit from skilled OT to address above impairments and improve overall function.  MODIFICATION OR ASSISTANCE TO COMPLETE EVALUATION: No modification of tasks or assist necessary to complete an evaluation.  OT OCCUPATIONAL PROFILE AND HISTORY: Problem focused assessment: Including review of records relating to presenting problem.  CLINICAL DECISION MAKING: Moderate - several treatment options, min-mod task modification necessary  REHAB POTENTIAL: Good  EVALUATION COMPLEXITY: Low      PLAN:  OT FREQUENCY: 1x/week  OT DURATION: 6 weeks (plus eval)   PLANNED INTERVENTIONS: self care/ADL training, therapeutic exercise, therapeutic activity, neuromuscular re-education, manual therapy, passive range of motion, paraffin, fluidotherapy, moist heat, patient/family education, and DME and/or AE instructions  RECOMMENDED OTHER SERVICES: none at this time  CONSULTED AND AGREED WITH PLAN OF CARE: Patient  PLAN FOR NEXT SESSION: pt will most likely need to schedule more appointments and need renewal next visit. If A/ROM HEP did not cause pain to hand/thumb, issue putty HEP for Lt grip and pinch strength (prevent thumb IP joint hyperextension with pinch ex)   Sheran Lawless, OT 05/02/2023, 8:56 AM

## 2023-05-04 DIAGNOSIS — N289 Disorder of kidney and ureter, unspecified: Secondary | ICD-10-CM | POA: Diagnosis not present

## 2023-05-09 ENCOUNTER — Encounter: Payer: Medicare HMO | Admitting: Occupational Therapy

## 2023-05-12 NOTE — Progress Notes (Signed)
Cardiology Office Note    Patient Name: Damon Sanders Date of Encounter: 05/12/2023  Primary Care Provider:  Emilio Aspen, MD Primary Cardiologist:  Lance Muss, MD Primary Electrophysiologist: None   Past Medical History    Past Medical History:  Diagnosis Date   Hyperlipidemia    Hypertension   +  History of Present Illness   Damon Sanders is a 77 y.o. male with PMH of CAD s/p STEMI 12/2018 with DES to mid RCA, HLD, HTN, atrial flutter with catheter ablation completed 09/15/2022 who presents today for 14-month follow-up.  Damon Sanders was last seen on 10/11/2022 for follow-up after completing flutter ablation.  He was doing well with no recurrence of tachycardia.  He experienced some bilateral hand pain and was referred to rheumatology.  He was seen by Dr. Ladona Ridgel on 10/14/2022 and was doing well with no recurrence and discontinued his Xarelto.   During today's visit the patient reports he is doing well with no new cardiac complaints since previous visit.  He denies any episodes of tachycardia and has been compliant with his current medications without reactions.  He endorses some episodes of dizziness which by description sound similar to vertigo.  I advised him to follow-up with his PCP regarding recommendations.  His blood pressure today was controlled at 120/60 and heart rate was 59 bpm.  Patient denies chest pain, palpitations, dyspnea, PND, orthopnea, nausea, vomiting, dizziness, syncope, edema, weight gain, or early satiety.  Review of Systems  Please see the history of present illness.    All other systems reviewed and are otherwise negative except as noted above.  Physical Exam    Wt Readings from Last 3 Encounters:  04/26/23 164 lb (74.4 kg)  03/29/23 167 lb 3.2 oz (75.8 kg)  10/14/22 166 lb 6.4 oz (75.5 kg)   ZO:XWRUE were no vitals filed for this visit.,There is no height or weight on file to calculate BMI. GEN: Well nourished, well developed in no  acute distress Neck: No JVD; No carotid bruits Pulmonary: Clear to auscultation without rales, wheezing or rhonchi  Cardiovascular: Normal rate. Regular rhythm. Normal S1. Normal S2.   Murmurs: There is no murmur.  ABDOMEN: Soft, non-tender, non-distended EXTREMITIES:  No edema; No deformity   EKG/LABS/ Recent Cardiac Studies   ECG personally reviewed by me today -none completed today  Risk Assessment/Calculations:          Lab Results  Component Value Date   WBC 8.4 10/11/2022   HGB 11.8 (L) 10/11/2022   HCT 34.6 (L) 10/11/2022   MCV 88 10/11/2022   PLT 206 10/11/2022   Lab Results  Component Value Date   CREATININE 0.98 08/23/2022   BUN 13 08/23/2022   NA 142 08/23/2022   K 3.6 08/23/2022   CL 106 08/23/2022   CO2 25 08/23/2022   No results found for: "CHOL", "HDL", "LDLCALC", "LDLDIRECT", "TRIG", "CHOLHDL"  No results found for: "HGBA1C" Assessment & Plan    1.Coronary artery disease: -s/p STEMI treated with DES x1 to RCA in 12/2018 -Today patient reports no chest pain or anginal equivalent since previous follow-up. -Continue current GDMT with Lipitor 80 mg daily, carvedilol 6.25 mg twice daily, Plavix 75 mg daily, Nitrostat 0.4 mg as needed and omega-3 fatty acid 1000 mg daily  2.Atrial flutter: -Patient presents today s/p atrial flutter ablation performed 09/15/2022. -Today he reports no changes bouts of tachycardia since previous follow-up. -He is no longer on blood thinner we will continue Plavix along with carvedilol  6.25 mg twice daily  3.Hyperlipidemia: -Last LDL was controlled at 47 -Continue Lipitor 80 mg daily  4.Essential hypertension: -Patient's blood pressure today was well-controlled at 120/60 -Continue carvedilol 6.25 mg twice daily losartan 100 mg daily and HCTZ 12.5 mg  Disposition: Follow-up with Lance Muss, MD or APP in 12 months    Signed, Napoleon Form, Leodis Rains, NP 05/12/2023, 7:54 PM [facility]

## 2023-05-13 ENCOUNTER — Encounter: Payer: Self-pay | Admitting: Nurse Practitioner

## 2023-05-13 ENCOUNTER — Ambulatory Visit: Payer: Medicare HMO | Attending: Interventional Cardiology | Admitting: Nurse Practitioner

## 2023-05-13 ENCOUNTER — Other Ambulatory Visit: Payer: Self-pay | Admitting: Interventional Cardiology

## 2023-05-13 VITALS — BP 120/60 | HR 59 | Ht 67.0 in | Wt 165.0 lb

## 2023-05-13 DIAGNOSIS — I4892 Unspecified atrial flutter: Secondary | ICD-10-CM

## 2023-05-13 DIAGNOSIS — I10 Essential (primary) hypertension: Secondary | ICD-10-CM | POA: Diagnosis not present

## 2023-05-13 DIAGNOSIS — I25118 Atherosclerotic heart disease of native coronary artery with other forms of angina pectoris: Secondary | ICD-10-CM | POA: Diagnosis not present

## 2023-05-13 DIAGNOSIS — E785 Hyperlipidemia, unspecified: Secondary | ICD-10-CM

## 2023-05-13 NOTE — Patient Instructions (Signed)
Medication Instructions:  Your physician recommends that you continue on your current medications as directed. Please refer to the Current Medication list given to you today. *If you need a refill on your cardiac medications before your next appointment, please call your pharmacy*   Lab Work: None ordered   Testing/Procedures: None ordered   Follow-Up: At [facility], you and your health needs are our priority.  As part of our continuing mission to provide you with exceptional heart care, we have created designated Provider Care Teams.  These Care Teams include your primary Cardiologist (physician) and Advanced Practice Providers (APPs -  Physician Assistants and Nurse Practitioners) who all work together to provide you with the care you need, when you need it.  We recommend signing up for the patient portal called "MyChart".  Sign up information is provided on this After Visit Summary.  MyChart is used to connect with patients for Virtual Visits (Telemedicine).  Patients are able to view lab/test results, encounter notes, upcoming appointments, etc.  Non-urgent messages can be sent to your provider as well.   To learn more about what you can do with MyChart, go to [email or web address].    Your next appointment:   12 month(s)  Provider:   Donato Schultz, MD Dr Rosemary Holms  Other Instructions

## 2023-05-16 ENCOUNTER — Ambulatory Visit: Payer: Medicare HMO | Admitting: Interventional Cardiology

## 2023-05-17 ENCOUNTER — Ambulatory Visit: Payer: Medicare HMO | Admitting: Occupational Therapy

## 2023-05-31 DIAGNOSIS — Z961 Presence of intraocular lens: Secondary | ICD-10-CM | POA: Diagnosis not present

## 2023-05-31 DIAGNOSIS — H43813 Vitreous degeneration, bilateral: Secondary | ICD-10-CM | POA: Diagnosis not present

## 2023-05-31 DIAGNOSIS — G43B0 Ophthalmoplegic migraine, not intractable: Secondary | ICD-10-CM | POA: Diagnosis not present

## 2023-05-31 DIAGNOSIS — H401122 Primary open-angle glaucoma, left eye, moderate stage: Secondary | ICD-10-CM | POA: Diagnosis not present

## 2023-05-31 DIAGNOSIS — H26493 Other secondary cataract, bilateral: Secondary | ICD-10-CM | POA: Diagnosis not present

## 2023-07-05 DIAGNOSIS — H26491 Other secondary cataract, right eye: Secondary | ICD-10-CM | POA: Diagnosis not present

## 2023-08-02 DIAGNOSIS — I4892 Unspecified atrial flutter: Secondary | ICD-10-CM | POA: Diagnosis not present

## 2023-08-02 DIAGNOSIS — Z23 Encounter for immunization: Secondary | ICD-10-CM | POA: Diagnosis not present

## 2023-08-02 DIAGNOSIS — Z1331 Encounter for screening for depression: Secondary | ICD-10-CM | POA: Diagnosis not present

## 2023-08-02 DIAGNOSIS — I10 Essential (primary) hypertension: Secondary | ICD-10-CM | POA: Diagnosis not present

## 2023-08-02 DIAGNOSIS — Z79899 Other long term (current) drug therapy: Secondary | ICD-10-CM | POA: Diagnosis not present

## 2023-08-02 DIAGNOSIS — N1831 Chronic kidney disease, stage 3a: Secondary | ICD-10-CM | POA: Diagnosis not present

## 2023-08-02 DIAGNOSIS — Z125 Encounter for screening for malignant neoplasm of prostate: Secondary | ICD-10-CM | POA: Diagnosis not present

## 2023-08-02 DIAGNOSIS — M792 Neuralgia and neuritis, unspecified: Secondary | ICD-10-CM | POA: Diagnosis not present

## 2023-08-02 DIAGNOSIS — E042 Nontoxic multinodular goiter: Secondary | ICD-10-CM | POA: Diagnosis not present

## 2023-08-02 DIAGNOSIS — M159 Polyosteoarthritis, unspecified: Secondary | ICD-10-CM | POA: Diagnosis not present

## 2023-08-02 DIAGNOSIS — R748 Abnormal levels of other serum enzymes: Secondary | ICD-10-CM | POA: Diagnosis not present

## 2023-08-02 DIAGNOSIS — Z0000 Encounter for general adult medical examination without abnormal findings: Secondary | ICD-10-CM | POA: Diagnosis not present

## 2023-08-02 DIAGNOSIS — E7800 Pure hypercholesterolemia, unspecified: Secondary | ICD-10-CM | POA: Diagnosis not present

## 2023-08-20 ENCOUNTER — Other Ambulatory Visit: Payer: Self-pay | Admitting: Nurse Practitioner

## 2023-10-09 ENCOUNTER — Other Ambulatory Visit: Payer: Self-pay | Admitting: Interventional Cardiology

## 2023-11-30 DIAGNOSIS — H26492 Other secondary cataract, left eye: Secondary | ICD-10-CM | POA: Diagnosis not present

## 2023-11-30 DIAGNOSIS — Z961 Presence of intraocular lens: Secondary | ICD-10-CM | POA: Diagnosis not present

## 2023-11-30 DIAGNOSIS — H401122 Primary open-angle glaucoma, left eye, moderate stage: Secondary | ICD-10-CM | POA: Diagnosis not present

## 2023-11-30 DIAGNOSIS — H43813 Vitreous degeneration, bilateral: Secondary | ICD-10-CM | POA: Diagnosis not present

## 2023-12-27 NOTE — Progress Notes (Signed)
 Office Visit Note  Patient: Damon Sanders             Date of Birth: 24-Apr-1946           MRN: [id number]             PCP: Benedetta Bradley, MD Referring: Benedetta Bradley, * Visit Date: 01/05/2024 Occupation: @GUAROCC @  Subjective:  Right shoulder pain  History of Present Illness: ERICA OSUNA is a 78 y.o. male with osteoarthritis.  He states that he was helping a friend build a shed and after that his right shoulder started hurting.  He has been having right shoulder joint pain for the last 2 weeks.  He has more pain with abducting his right arm.  His left shoulder is doing better after the injection giving last time.  He had 1 episode of trigger left thumb but no recurrence after that.  He denies much discomfort in his hands today.  His feet feel better with orthotics and new shoes.    Activities of Daily Living:  Patient reports morning stiffness for a few minutes.   Patient Denies nocturnal pain.  Difficulty dressing/grooming: Denies Difficulty climbing stairs: Denies Difficulty getting out of chair: Denies Difficulty using hands for taps, buttons, cutlery, and/or writing: Reports  Review of Systems  Constitutional:  Negative for fatigue.  HENT:  Negative for mouth sores and mouth dryness.   Eyes:  Negative for dryness.  Respiratory:  Negative for shortness of breath.   Cardiovascular:  Negative for chest pain and palpitations.  Gastrointestinal:  Negative for blood in stool, constipation and diarrhea.  Endocrine: Negative for increased urination.  Genitourinary:  Negative for involuntary urination.  Musculoskeletal:  Positive for joint pain, joint pain, joint swelling and morning stiffness. Negative for gait problem, myalgias, muscle weakness, muscle tenderness and myalgias.  Skin:  Negative for color change, rash and sensitivity to sunlight.  Allergic/Immunologic: Negative for susceptible to infections.  Neurological:  Positive for dizziness. Negative  for headaches.  Hematological:  Negative for swollen glands.  Psychiatric/Behavioral:  Negative for depressed mood and sleep disturbance. The patient is not nervous/anxious.     PMFS History:  Patient Active Problem List   Diagnosis Date Noted   Primary osteoarthritis of both hands 01/05/2024   Pes cavus of both feet 01/05/2024   Coronary artery disease 01/05/2024   Benign essential HTN 10/14/2022   Atrial flutter (HCC) 07/16/2022    Past Medical History:  Diagnosis Date   Hyperlipidemia    Hypertension     Family History  Problem Relation Age of Onset   Colon cancer Father    Esophageal cancer Neg Hx    Rectal cancer Neg Hx    Stomach cancer Neg Hx    Past Surgical History:  Procedure Laterality Date   A-FLUTTER ABLATION N/A 09/15/2022   Procedure: A-FLUTTER ABLATION;  Surgeon: Tammie Fall, MD;  Location: [facility];  Service: Cardiovascular;  Laterality: N/A;   LEFT HEART CATH  01/11/2019   THYROIDECTOMY, PARTIAL  08/16/1997   Social History   Social History Narrative   Not on file   Immunization History  Administered Date(s) Administered   PFIZER(Purple Top)SARS-COV-2 Vaccination 09/30/2019, 10/23/2019, 07/19/2020   Pfizer(Comirnaty)Fall Seasonal Vaccine 12 years and older 08/02/2023     Objective: Vital Signs: BP (!) 154/72 (BP Location: Left Arm, Patient Position: Sitting, Cuff Size: Normal)   Pulse 65   Resp 15   Ht 5\' 7"  (1.702 m)   Wt  166 lb (75.3 kg)   BMI 26.00 kg/m    Physical Exam Vitals and nursing note reviewed.  Constitutional:      Appearance: He is well-developed.  HENT:     Head: Normocephalic and atraumatic.  Eyes:     Conjunctiva/sclera: Conjunctivae normal.     Pupils: Pupils are equal, round, and reactive to light.  Cardiovascular:     Rate and Rhythm: Normal rate and regular rhythm.     Heart sounds: Normal heart sounds.  Pulmonary:     Effort: Pulmonary effort is normal.     Breath sounds: Normal breath sounds.   Abdominal:     General: Bowel sounds are normal.     Palpations: Abdomen is soft.  Musculoskeletal:     Cervical back: Normal range of motion and neck supple.  Skin:    General: Skin is warm and dry.     Capillary Refill: Capillary refill takes less than 2 seconds.  Neurological:     Mental Status: He is alert and oriented to person, place, and time.  Psychiatric:        Behavior: Behavior normal.      Musculoskeletal Exam: Cervical, thoracic and lumbar spine with good range of motion.  He had painful abduction and internal rotation of his right shoulder.  Left shoulder joint was in full range of motion.  Elbows, wrist joints, MCPs with good range of motion.  He had bilateral CMC PIP and DIP thickening with no synovitis.  Hip joints and knee joints in good range of motion without any warmth swelling or effusion.  There was no tenderness over ankles or MTPs.  CDAI Exam: CDAI Score: -- Patient Global: --; Provider Global: -- Swollen: --; Tender: -- Joint Exam 01/05/2024   No joint exam has been documented for this visit   There is currently no information documented on the homunculus. Go to the Rheumatology activity and complete the homunculus joint exam.  Investigation: No additional findings.  Imaging: No results found.  Recent Labs: Lab Results  Component Value Date   WBC 8.4 10/11/2022   HGB 11.8 (L) 10/11/2022   PLT 206 10/11/2022   NA 142 08/23/2022   K 3.6 08/23/2022   CL 106 08/23/2022   CO2 25 08/23/2022   GLUCOSE 120 (H) 08/23/2022   BUN 13 08/23/2022   CREATININE 0.98 08/23/2022   CALCIUM  9.3 08/23/2022   GFRAA  03/15/2007    >60        The eGFR has been calculated using the MDRD equation. This calculation has not been validated in all clinical    Speciality Comments: No specialty comments available.  Procedures:  Large Joint Inj: R glenohumeral on 01/05/2024 8:18 AM Indications: pain Details: 27 G 1.5 in needle, posterior approach  Arthrogram:  No  Medications: 40 mg triamcinolone  acetonide 40 MG/ML; 1.5 mL lidocaine  1 % Aspirate: 0 mL Outcome: tolerated well, no immediate complications  Risk of infection, tendon injury, nerve injury, dermal atrophy, hypopigmentation were discussed. Procedure, treatment alternatives, risks and benefits explained, specific risks discussed. Consent was given by the patient. Immediately prior to procedure a time out was called to verify the correct patient, procedure, equipment, support staff and site/side marked as required. Patient was prepped and draped in the usual sterile fashion.     Allergies: Caine-1 [lidocaine ], Penicillins, Procaine, and Lisinopril   Assessment / Plan:     Visit Diagnoses: Acute right shoulder pain-patient reports increased pain in his right shoulder joint for the last 2  weeks.  He states he was building a shed and after that the pain got worse.  He is having difficulty sleeping and doing routine activities.  He would like to have a cortisone injection.  He had a good results to cortisone injection in the past.  He has tried topical agents and over-the-counter meds without much relief.  After informed consent was obtained and side effects were discussed right glenohumeral joint was injected with lidocaine  and Kenalog  as described above.  Patient tolerated the procedure well.  Postprocedure instructions were given.  A handout for shoulder exercises was given.  Chronic pain of both shoulders - Status post left shoulder joint injection March 29, 2023.  X-rays of bilateral shoulder joints obtained on March 29, 2023 were reviewed which were unremarkable.  Primary osteoarthritis of both hands -he has bilateral PIP and DIP thickening.  He states he had an episode of trigger thumb which resolved.  I advised him to contact me if he develops any new symptoms.  March 29 2023 ESR 2, uric acid 4.8, RF negative, anti-CCP negative.  The initial x-rays were suggestive of  osteoarthritis.  Bilateral carpal tunnel syndrome - He had bilateral carpal tunnel syndrome injections by Dr. Donzella Galley in the past.  Pes cavus of both feet-symptoms have improved since he has been using orthotics.  Hammertoes of both feet-proper fitting shoes were discussed.  Other medical problems are listed as follows:  Coronary artery disease involving native coronary artery of native heart with other form of angina pectoris (HCC)  Typical atrial flutter (HCC)  Benign essential HTN-blood pressure was elevated at 148/72.  Repeat blood pressure was 154/72.  He was advised to monitor blood pressure closely and follow-up with his PCP.  Dyslipidemia  History of glaucoma  Orders: Orders Placed This Encounter  Procedures   Large Joint Inj   No orders of the defined types were placed in this encounter.    Follow-Up Instructions: Return in about 6 months (around 07/07/2024) for Osteoarthritis.   Nicholas Bari, MD  Note - This record has been created using Animal nutritionist.  Chart creation errors have been sought, but may not always  have been located. Such creation errors do not reflect on  the standard of medical care.

## 2024-01-05 ENCOUNTER — Encounter: Payer: Self-pay | Admitting: Rheumatology

## 2024-01-05 ENCOUNTER — Ambulatory Visit: Attending: Rheumatology | Admitting: Rheumatology

## 2024-01-05 VITALS — BP 154/72 | HR 65 | Resp 15 | Ht 67.0 in | Wt 166.0 lb

## 2024-01-05 DIAGNOSIS — I25118 Atherosclerotic heart disease of native coronary artery with other forms of angina pectoris: Secondary | ICD-10-CM

## 2024-01-05 DIAGNOSIS — I483 Typical atrial flutter: Secondary | ICD-10-CM | POA: Diagnosis not present

## 2024-01-05 DIAGNOSIS — Q6671 Congenital pes cavus, right foot: Secondary | ICD-10-CM | POA: Diagnosis not present

## 2024-01-05 DIAGNOSIS — I10 Essential (primary) hypertension: Secondary | ICD-10-CM

## 2024-01-05 DIAGNOSIS — G8929 Other chronic pain: Secondary | ICD-10-CM

## 2024-01-05 DIAGNOSIS — M25511 Pain in right shoulder: Secondary | ICD-10-CM | POA: Diagnosis not present

## 2024-01-05 DIAGNOSIS — E785 Hyperlipidemia, unspecified: Secondary | ICD-10-CM | POA: Diagnosis not present

## 2024-01-05 DIAGNOSIS — M19041 Primary osteoarthritis, right hand: Secondary | ICD-10-CM | POA: Diagnosis not present

## 2024-01-05 DIAGNOSIS — I2510 Atherosclerotic heart disease of native coronary artery without angina pectoris: Secondary | ICD-10-CM | POA: Insufficient documentation

## 2024-01-05 DIAGNOSIS — G5603 Carpal tunnel syndrome, bilateral upper limbs: Secondary | ICD-10-CM

## 2024-01-05 DIAGNOSIS — M19042 Primary osteoarthritis, left hand: Secondary | ICD-10-CM

## 2024-01-05 DIAGNOSIS — Z8639 Personal history of other endocrine, nutritional and metabolic disease: Secondary | ICD-10-CM

## 2024-01-05 DIAGNOSIS — M25512 Pain in left shoulder: Secondary | ICD-10-CM

## 2024-01-05 DIAGNOSIS — M2041 Other hammer toe(s) (acquired), right foot: Secondary | ICD-10-CM | POA: Diagnosis not present

## 2024-01-05 DIAGNOSIS — M2042 Other hammer toe(s) (acquired), left foot: Secondary | ICD-10-CM

## 2024-01-05 DIAGNOSIS — Q6672 Congenital pes cavus, left foot: Secondary | ICD-10-CM

## 2024-01-05 DIAGNOSIS — Z8669 Personal history of other diseases of the nervous system and sense organs: Secondary | ICD-10-CM

## 2024-01-05 MED ORDER — LIDOCAINE HCL 1 % IJ SOLN
1.5000 mL | INTRAMUSCULAR | Status: AC | PRN
  Administered 2024-01-05: 1.5 mL

## 2024-01-05 MED ORDER — TRIAMCINOLONE ACETONIDE 40 MG/ML IJ SUSP
40.0000 mg | INTRAMUSCULAR | Status: AC | PRN
Start: 2024-01-05 — End: 2024-01-05
  Administered 2024-01-05: 40 mg via INTRA_ARTICULAR

## 2024-01-05 NOTE — Patient Instructions (Signed)
Shoulder Exercises Ask your health care provider which exercises are safe for you. Do exercises exactly as told by your health care provider and adjust them as directed. It is normal to feel mild stretching, pulling, tightness, or discomfort as you do these exercises. Stop right away if you feel sudden pain or your pain gets worse. Do not begin these exercises until told by your health care provider. Stretching exercises External rotation and abduction This exercise is sometimes called corner stretch. The exercise rotates your arm outward (external rotation) and moves your arm out from your body (abduction). Stand in a doorway with one of your feet slightly in front of the other. This is called a staggered stance. If you cannot reach your forearms to the door frame, stand facing a corner of a room. Choose one of the following positions as told by your health care provider: Place your hands and forearms on the door frame above your head. Place your hands and forearms on the door frame at the height of your head. Place your hands on the door frame at the height of your elbows. Slowly move your weight onto your front foot until you feel a stretch across your chest and in the front of your shoulders. Keep your head and chest upright and keep your abdominal muscles tight. Hold for __________ seconds. To release the stretch, shift your weight to your back foot. Repeat __________ times. Complete this exercise __________ times a day. Extension, standing  Stand and hold a broomstick, a cane, or a similar object behind your back. Your hands should be a little wider than shoulder-width apart. Your palms should face away from your back. Keeping your elbows straight and your shoulder muscles relaxed, move the stick away from your body until you feel a stretch in your shoulders (extension). Avoid shrugging your shoulders while you move the stick. Keep your shoulder blades tucked down toward the middle of your  back. Hold for __________ seconds. Slowly return to the starting position. Repeat __________ times. Complete this exercise __________ times a day. Range-of-motion exercises Pendulum  Stand near a wall or a surface that you can hold onto for balance. Bend at the waist and let your left / right arm hang straight down. Use your other arm to support you. Keep your back straight and do not lock your knees. Relax your left / right arm and shoulder muscles, and move your hips and your trunk so your left / right arm swings freely. Your arm should swing because of the motion of your body, not because you are using your arm or shoulder muscles. Keep moving your hips and trunk so your arm swings in the following directions, as told by your health care provider: Side to side. Forward and backward. In clockwise and counterclockwise circles. Continue each motion for __________ seconds, or for as long as told by your health care provider. Slowly return to the starting position. Repeat __________ times. Complete this exercise __________ times a day. Shoulder flexion, standing  Stand and hold a broomstick, a cane, or a similar object. Place your hands a little more than shoulder-width apart on the object. Your left / right hand should be palm-up, and your other hand should be palm-down. Keep your elbow straight and your shoulder muscles relaxed. Push the stick up with your healthy arm to raise your left / right arm in front of your body, and then over your head until you feel a stretch in your shoulder (flexion). Avoid shrugging your shoulder  while you raise your arm. Keep your shoulder blade tucked down toward the middle of your back. Hold for __________ seconds. Slowly return to the starting position. Repeat __________ times. Complete this exercise __________ times a day. Shoulder abduction, standing  Stand and hold a broomstick, a cane, or a similar object. Place your hands a little more than  shoulder-width apart on the object. Your left / right hand should be palm-up, and your other hand should be palm-down. Keep your elbow straight and your shoulder muscles relaxed. Push the object across your body toward your left / right side. Raise your left / right arm to the side of your body (abduction) until you feel a stretch in your shoulder. Do not raise your arm above shoulder height unless your health care provider tells you to do that. If directed, raise your arm over your head. Avoid shrugging your shoulder while you raise your arm. Keep your shoulder blade tucked down toward the middle of your back. Hold for __________ seconds. Slowly return to the starting position. Repeat __________ times. Complete this exercise __________ times a day. Internal rotation  Place your left / right hand behind your back, palm-up. Use your other hand to dangle an exercise band, a broomstick, or a similar object over your shoulder. Grasp the band with your left / right hand so you are holding on to both ends. Gently pull up on the band until you feel a stretch in the front of your left / right shoulder. The movement of your arm toward the center of your body is called internal rotation. Avoid shrugging your shoulder while you raise your arm. Keep your shoulder blade tucked down toward the middle of your back. Hold for __________ seconds. Release the stretch by letting go of the band and lowering your hands. Repeat __________ times. Complete this exercise __________ times a day. Strengthening exercises External rotation  Sit in a stable chair without armrests. Secure an exercise band to a stable object at elbow height on your left / right side. Place a soft object, such as a folded towel or a small pillow, between your left / right upper arm and your body to move your elbow about 4 inches (10 cm) away from your side. Hold the end of the exercise band so it is tight and there is no slack. Keeping your  elbow pressed against the soft object, slowly move your forearm out, away from your abdomen (external rotation). Keep your body steady so only your forearm moves. Hold for __________ seconds. Slowly return to the starting position. Repeat __________ times. Complete this exercise __________ times a day. Shoulder abduction  Sit in a stable chair without armrests, or stand up. Hold a __________ lb / kg weight in your left / right hand, or hold an exercise band with both hands. Start with your arms straight down and your left / right palm facing in, toward your body. Slowly lift your left / right hand out to your side (abduction). Do not lift your hand above shoulder height unless your health care provider tells you that this is safe. Keep your arms straight. Avoid shrugging your shoulder while you do this movement. Keep your shoulder blade tucked down toward the middle of your back. Hold for __________ seconds. Slowly lower your arm, and return to the starting position. Repeat __________ times. Complete this exercise __________ times a day. Shoulder extension  Sit in a stable chair without armrests, or stand up. Secure an exercise band to a  stable object in front of you so it is at shoulder height. Hold one end of the exercise band in each hand. Straighten your elbows and lift your hands up to shoulder height. Squeeze your shoulder blades together as you pull your hands down to the sides of your thighs (extension). Stop when your hands are straight down by your sides. Do not let your hands go behind your body. Hold for __________ seconds. Slowly return to the starting position. Repeat __________ times. Complete this exercise __________ times a day. Shoulder row  Sit in a stable chair without armrests, or stand up. Secure an exercise band to a stable object in front of you so it is at chest height. Hold one end of the exercise band in each hand. Position your palms so that your thumbs are  facing the ceiling (neutral position). Bend each of your elbows to a 90-degree angle (right angle) and keep your upper arms at your sides. Step back or move the chair back until the band is tight and there is no slack. Slowly pull your elbows back behind you. Hold for __________ seconds. Slowly return to the starting position. Repeat __________ times. Complete this exercise __________ times a day. Shoulder press-ups  Sit in a stable chair that has armrests. Sit upright, with your feet flat on the floor. Put your hands on the armrests so your elbows are bent and your fingers are pointing forward. Your hands should be about even with the sides of your body. Push down on the armrests and use your arms to lift yourself off the chair. Straighten your elbows and lift yourself up as much as you comfortably can. Move your shoulder blades down, and avoid letting your shoulders move up toward your ears. Keep your feet on the ground. As you get stronger, your feet should support less of your body weight as you lift yourself up. Hold for __________ seconds. Slowly lower yourself back into the chair. Repeat __________ times. Complete this exercise __________ times a day. Wall push-ups  Stand so you are facing a stable wall. Your feet should be about one arm-length away from the wall. Lean forward and place your palms on the wall at shoulder height. Keep your feet flat on the floor as you bend your elbows and lean forward toward the wall. Hold for __________ seconds. Straighten your elbows to push yourself back to the starting position. Repeat __________ times. Complete this exercise __________ times a day. This information is not intended to replace advice given to you by your health care provider. Make sure you discuss any questions you have with your health care provider. Document Revised: 09/22/2021 Document Reviewed: 09/22/2021 Elsevier Patient Education  2024 [employer] Hand Exercises Hand  exercises can be helpful for almost anyone. They can strengthen your hands and improve flexibility and movement. The exercises can also increase blood flow to the hands. These results can make your work and daily tasks easier for you. Hand exercises can be especially helpful for people who have joint pain from arthritis or nerve damage from using their hands over and over. These exercises can also help people who injure a hand. Exercises Most of these hand exercises are gentle stretching and motion exercises. It is usually safe to do them often throughout the day. Warming up your hands before exercise may help reduce stiffness. You can do this with gentle massage or by placing your hands in warm water for 10-15 minutes. It is normal to feel some stretching, pulling,  tightness, or mild discomfort when you begin new exercises. In time, this will improve. Remember to always be careful and stop right away if you feel sudden, very bad pain or your pain gets worse. You want to get better and be safe. Ask your health care provider which exercises are safe for you. Do exercises exactly as told by your provider and adjust them as told. Do not begin these exercises until told by your provider. Knuckle bend or "claw" fist  Stand or sit with your arm, hand, and all five fingers pointed straight up. Make sure to keep your wrist straight. Gently bend your fingers down toward your palm until the tips of your fingers are touching your palm. Keep your big knuckle straight and only bend the small knuckles in your fingers. Hold this position for 10 seconds. Straighten your fingers back to your starting position. Repeat this exercise 5-10 times with each hand. Full finger fist  Stand or sit with your arm, hand, and all five fingers pointed straight up. Make sure to keep your wrist straight. Gently bend your fingers into your palm until the tips of your fingers are touching the middle of your palm. Hold this position  for 10 seconds. Extend your fingers back to your starting position, stretching every joint fully. Repeat this exercise 5-10 times with each hand. Straight fist  Stand or sit with your arm, hand, and all five fingers pointed straight up. Make sure to keep your wrist straight. Gently bend your fingers at the big knuckle, where your fingers meet your hand, and at the middle knuckle. Keep the knuckle at the tips of your fingers straight and try to touch the bottom of your palm. Hold this position for 10 seconds. Extend your fingers back to your starting position, stretching every joint fully. Repeat this exercise 5-10 times with each hand. Tabletop  Stand or sit with your arm, hand, and all five fingers pointed straight up. Make sure to keep your wrist straight. Gently bend your fingers at the big knuckle, where your fingers meet your hand, as far down as you can. Keep the small knuckles in your fingers straight. Think of forming a tabletop with your fingers. Hold this position for 10 seconds. Extend your fingers back to your starting position, stretching every joint fully. Repeat this exercise 5-10 times with each hand. Finger spread  Place your hand flat on a table with your palm facing down. Make sure your wrist stays straight. Spread your fingers and thumb apart from each other as far as you can until you feel a gentle stretch. Hold this position for 10 seconds. Bring your fingers and thumb tight together again. Hold this position for 10 seconds. Repeat this exercise 5-10 times with each hand. Making circles  Stand or sit with your arm, hand, and all five fingers pointed straight up. Make sure to keep your wrist straight. Make a circle by touching the tip of your thumb to the tip of your index finger. Hold for 10 seconds. Then open your hand wide. Repeat this motion with your thumb and each of your fingers. Repeat this exercise 5-10 times with each hand. Thumb motion  Sit with your  forearm resting on a table and your wrist straight. Your thumb should be facing up toward the ceiling. Keep your fingers relaxed as you move your thumb. Lift your thumb up as high as you can toward the ceiling. Hold for 10 seconds. Bend your thumb across your palm as far as  you can, reaching the tip of your thumb for the small finger (pinkie) side of your palm. Hold for 10 seconds. Repeat this exercise 5-10 times with each hand. Grip strengthening  Hold a stress ball or other soft ball in the middle of your hand. Slowly increase the pressure, squeezing the ball as much as you can without causing pain. Think of bringing the tips of your fingers into the middle of your palm. All of your finger joints should bend when doing this exercise. Hold your squeeze for 10 seconds, then relax. Repeat this exercise 5-10 times with each hand. Contact a health care provider if: Your hand pain or discomfort gets much worse when you do an exercise. Your hand pain or discomfort does not improve within 2 hours after you exercise. If you have either of these problems, stop doing these exercises right away. Do not do them again unless your provider says that you can. Get help right away if: You develop sudden, severe hand pain or swelling. If this happens, stop doing these exercises right away. Do not do them again unless your provider says that you can. This information is not intended to replace advice given to you by your health care provider. Make sure you discuss any questions you have with your health care provider. Document Revised: 08/17/2022 Document Reviewed: 08/17/2022 Elsevier Patient Education  2024 [employer].

## 2024-01-10 DIAGNOSIS — I10 Essential (primary) hypertension: Secondary | ICD-10-CM | POA: Diagnosis not present

## 2024-01-10 DIAGNOSIS — I2510 Atherosclerotic heart disease of native coronary artery without angina pectoris: Secondary | ICD-10-CM | POA: Diagnosis not present

## 2024-01-14 DIAGNOSIS — M159 Polyosteoarthritis, unspecified: Secondary | ICD-10-CM | POA: Diagnosis not present

## 2024-01-14 DIAGNOSIS — I10 Essential (primary) hypertension: Secondary | ICD-10-CM | POA: Diagnosis not present

## 2024-01-14 DIAGNOSIS — E7800 Pure hypercholesterolemia, unspecified: Secondary | ICD-10-CM | POA: Diagnosis not present

## 2024-01-14 DIAGNOSIS — I2510 Atherosclerotic heart disease of native coronary artery without angina pectoris: Secondary | ICD-10-CM | POA: Diagnosis not present

## 2024-01-24 DIAGNOSIS — M159 Polyosteoarthritis, unspecified: Secondary | ICD-10-CM | POA: Diagnosis not present

## 2024-01-24 DIAGNOSIS — I2510 Atherosclerotic heart disease of native coronary artery without angina pectoris: Secondary | ICD-10-CM | POA: Diagnosis not present

## 2024-01-24 DIAGNOSIS — R252 Cramp and spasm: Secondary | ICD-10-CM | POA: Diagnosis not present

## 2024-01-24 DIAGNOSIS — M792 Neuralgia and neuritis, unspecified: Secondary | ICD-10-CM | POA: Diagnosis not present

## 2024-01-24 DIAGNOSIS — E7800 Pure hypercholesterolemia, unspecified: Secondary | ICD-10-CM | POA: Diagnosis not present

## 2024-01-24 DIAGNOSIS — E042 Nontoxic multinodular goiter: Secondary | ICD-10-CM | POA: Diagnosis not present

## 2024-01-24 DIAGNOSIS — L989 Disorder of the skin and subcutaneous tissue, unspecified: Secondary | ICD-10-CM | POA: Diagnosis not present

## 2024-01-24 DIAGNOSIS — I10 Essential (primary) hypertension: Secondary | ICD-10-CM | POA: Diagnosis not present

## 2024-02-08 DIAGNOSIS — I2510 Atherosclerotic heart disease of native coronary artery without angina pectoris: Secondary | ICD-10-CM | POA: Diagnosis not present

## 2024-02-08 DIAGNOSIS — I10 Essential (primary) hypertension: Secondary | ICD-10-CM | POA: Diagnosis not present

## 2024-02-13 DIAGNOSIS — M159 Polyosteoarthritis, unspecified: Secondary | ICD-10-CM | POA: Diagnosis not present

## 2024-02-13 DIAGNOSIS — E7800 Pure hypercholesterolemia, unspecified: Secondary | ICD-10-CM | POA: Diagnosis not present

## 2024-02-13 DIAGNOSIS — I10 Essential (primary) hypertension: Secondary | ICD-10-CM | POA: Diagnosis not present

## 2024-02-13 DIAGNOSIS — I2510 Atherosclerotic heart disease of native coronary artery without angina pectoris: Secondary | ICD-10-CM | POA: Diagnosis not present

## 2024-02-15 DIAGNOSIS — X32XXXA Exposure to sunlight, initial encounter: Secondary | ICD-10-CM | POA: Diagnosis not present

## 2024-02-15 DIAGNOSIS — D225 Melanocytic nevi of trunk: Secondary | ICD-10-CM | POA: Diagnosis not present

## 2024-02-15 DIAGNOSIS — L570 Actinic keratosis: Secondary | ICD-10-CM | POA: Diagnosis not present

## 2024-02-15 DIAGNOSIS — L820 Inflamed seborrheic keratosis: Secondary | ICD-10-CM | POA: Diagnosis not present

## 2024-03-03 ENCOUNTER — Other Ambulatory Visit: Payer: Self-pay | Admitting: Nurse Practitioner

## 2024-03-09 DIAGNOSIS — I2510 Atherosclerotic heart disease of native coronary artery without angina pectoris: Secondary | ICD-10-CM | POA: Diagnosis not present

## 2024-03-09 DIAGNOSIS — I10 Essential (primary) hypertension: Secondary | ICD-10-CM | POA: Diagnosis not present

## 2024-03-14 DIAGNOSIS — I10 Essential (primary) hypertension: Secondary | ICD-10-CM | POA: Diagnosis not present

## 2024-03-14 DIAGNOSIS — I2510 Atherosclerotic heart disease of native coronary artery without angina pectoris: Secondary | ICD-10-CM | POA: Diagnosis not present

## 2024-03-14 DIAGNOSIS — K649 Unspecified hemorrhoids: Secondary | ICD-10-CM | POA: Diagnosis not present

## 2024-03-15 DIAGNOSIS — I10 Essential (primary) hypertension: Secondary | ICD-10-CM | POA: Diagnosis not present

## 2024-03-15 DIAGNOSIS — I2510 Atherosclerotic heart disease of native coronary artery without angina pectoris: Secondary | ICD-10-CM | POA: Diagnosis not present

## 2024-03-15 DIAGNOSIS — M159 Polyosteoarthritis, unspecified: Secondary | ICD-10-CM | POA: Diagnosis not present

## 2024-03-15 DIAGNOSIS — E7800 Pure hypercholesterolemia, unspecified: Secondary | ICD-10-CM | POA: Diagnosis not present

## 2024-03-28 ENCOUNTER — Other Ambulatory Visit: Payer: Self-pay | Admitting: Nurse Practitioner

## 2024-04-08 DIAGNOSIS — I2510 Atherosclerotic heart disease of native coronary artery without angina pectoris: Secondary | ICD-10-CM | POA: Diagnosis not present

## 2024-04-08 DIAGNOSIS — I10 Essential (primary) hypertension: Secondary | ICD-10-CM | POA: Diagnosis not present

## 2024-04-15 DIAGNOSIS — I2510 Atherosclerotic heart disease of native coronary artery without angina pectoris: Secondary | ICD-10-CM | POA: Diagnosis not present

## 2024-04-15 DIAGNOSIS — E7800 Pure hypercholesterolemia, unspecified: Secondary | ICD-10-CM | POA: Diagnosis not present

## 2024-04-15 DIAGNOSIS — I10 Essential (primary) hypertension: Secondary | ICD-10-CM | POA: Diagnosis not present

## 2024-04-15 DIAGNOSIS — M159 Polyosteoarthritis, unspecified: Secondary | ICD-10-CM | POA: Diagnosis not present

## 2024-04-25 ENCOUNTER — Ambulatory Visit: Payer: Medicare HMO | Admitting: Rheumatology

## 2024-05-07 ENCOUNTER — Ambulatory Visit: Attending: Cardiology | Admitting: Cardiology

## 2024-05-07 ENCOUNTER — Encounter: Payer: Self-pay | Admitting: Cardiology

## 2024-05-07 VITALS — BP 122/60 | HR 56 | Ht 67.0 in | Wt 167.4 lb

## 2024-05-07 DIAGNOSIS — I10 Essential (primary) hypertension: Secondary | ICD-10-CM | POA: Diagnosis not present

## 2024-05-07 DIAGNOSIS — I483 Typical atrial flutter: Secondary | ICD-10-CM | POA: Diagnosis not present

## 2024-05-07 DIAGNOSIS — I25119 Atherosclerotic heart disease of native coronary artery with unspecified angina pectoris: Secondary | ICD-10-CM | POA: Diagnosis not present

## 2024-05-07 NOTE — Patient Instructions (Signed)
 Medication Instructions:  The current medical regimen is effective;  continue present plan and medications.  *If you need a refill on your cardiac medications before your next appointment, please call your pharmacy*  Follow-Up: At [facility], you and your health needs are our priority.  As part of our continuing mission to provide you with exceptional heart care, our providers are all part of one team.  This team includes your primary Cardiologist (physician) and Advanced Practice Providers or APPs (Physician Assistants and Nurse Practitioners) who all work together to provide you with the care you need, when you need it.  Your next appointment:   1 year(s)  Provider:   One of our Advanced Practice Providers (APPs): Morse Clause, PA-C  Lamarr Satterfield, NP Miriam Shams, NP  Olivia Pavy, PA-C Josefa Beauvais, NP  Leontine Salen, PA-C Orren Fabry, PA-C  [place], PA-C Ernest Dick, NP  Damien Braver, NP Jon Hails, PA-C  Waddell Donath, PA-C    Dayna Dunn, PA-C  Scott Weaver, PA-C Lum Louis, NP Katlyn West, NP Callie Goodrich, PA-C  Xika Zhao, NP Sheng Haley, PA-C    Kathleen Johnson, PA-C       We recommend signing up for the patient portal called MyChart.  Sign up information is provided on this After Visit Summary.  MyChart is used to connect with patients for Virtual Visits (Telemedicine).  Patients are able to view lab/test results, encounter notes, upcoming appointments, etc.  Non-urgent messages can be sent to your provider as well.   To learn more about what you can do with MyChart, go to [email or web address].

## 2024-05-07 NOTE — Progress Notes (Signed)
 Cardiology Office Note:  .   Date:  05/07/2024  ID:  Damon Sanders, DOB [birth year], MRN [id number] PCP: Charlott Dorn LABOR, MD  [facility] Providers Cardiologist:  Candyce Reek, MD     History of Present Illness: .   Damon Sanders is a 78 y.o. male Discussed the use of AI scribe software   History of Present Illness Damon Sanders is a 78 year old male with coronary artery disease status post ST elevation myocardial infarction who presents for follow-up.  He has a history of coronary artery disease with a ST elevation myocardial infarction in May 2020, treated with a drug-eluting stent to the mid right coronary artery. He continues to take Plavix  75 mg daily and atorvastatin  80 mg daily, with an LDL of 47.  He has hypertension, managed with HCTZ 12.5 mg and losartan 100 mg. He monitors his blood pressure daily, with readings around 109/40 to 107/40, and feels fine with these numbers. He notes occasional dizziness but not frequently.  He has hyperlipidemia, managed with atorvastatin  80 mg daily, and his cholesterol levels are well-controlled.  He has a history of atrial flutter, for which he underwent catheter ablation on September 15, 2022. There has been no recurrence of atrial flutter by patient report. He reports that his EKG was reassuring with right bundle branch block and sinus rhythm. He has discontinued Xarelto  following the ablation.  He is physically active, engaging in outdoor activities such as gardening and hunting. He enjoys woodworking and spends time creating intricate pieces.      Studies Reviewed: SABRA   EKG Interpretation Date/Time:  Monday May 07 2024 10:02:32 EDT Ventricular Rate:  56 PR Interval:  166 QRS Duration:  138 QT Interval:  462 QTC Calculation: 445 R Axis:   -33  Text Interpretation: Sinus bradycardia Left axis deviation Right bundle branch block Lateral infarct , age undetermined When compared with ECG of 11-Jun-2022  09:35, No significant change was found Confirmed by Jeffrie Anes ([id number]) on 05/07/2024 10:17:52 AM    Results LABS LDL: 47  DIAGNOSTIC EKG: Right bundle branch block, sinus rhythm (05/07/2024) Risk Assessment/Calculations:            Physical Exam:   VS:  BP 122/60 (BP Location: Left Arm, Patient Position: Sitting, Cuff Size: Normal)   Pulse (!) 56   Ht 5' 7 (1.702 m)   Wt 167 lb 6 oz (75.9 kg)   SpO2 98%   BMI 26.21 kg/m    Wt Readings from Last 3 Encounters:  05/07/24 167 lb 6 oz (75.9 kg)  01/05/24 166 lb (75.3 kg)  05/13/23 165 lb (74.8 kg)    GEN: Well nourished, well developed in no acute distress NECK: No JVD; No carotid bruits CARDIAC: RRR, no murmurs, no rubs, no gallops RESPIRATORY:  Clear to auscultation without rales, wheezing or rhonchi  ABDOMEN: Soft, non-tender, non-distended EXTREMITIES:  No edema; No deformity   ASSESSMENT AND PLAN: .    Assessment and Plan Assessment & Plan Atherosclerotic heart disease status post STEMI with drug-eluting stent Coronary artery disease with ST elevation myocardial infarction in May 2020, treated with a drug-eluting stent in the mid RCA. Currently asymptomatic. EKG shows sinus rhythm with right bundle branch block, no atrial flutter. - Continue Plavix  75 mg daily - Continue atorvastatin  80 mg daily - Continue carvedilol  6.25 mg twice daily  Atrial flutter status post ablation Atrial flutter treated with catheter ablation on September 15, 2022, with no recurrence. EKG shows  sinus rhythm. - Discontinue Xarelto  as per previous cardiology recommendation  Essential hypertension Blood pressure generally stable with occasional low readings (e.g., 109/40). No symptoms of dizziness or orthostatic hypotension, except one isolated incident. Managed with HCTZ 12.5 mg and losartan 100 mg. - Continue HCTZ 12.5 mg daily - Continue losartan 100 mg daily - Advise to report any persistent dizziness or symptoms of  hypotension  Hyperlipidemia Hyperlipidemia well-controlled with atorvastatin  80 mg daily. LDL levels at target (47 mg/dL). - Continue atorvastatin  80 mg daily - Continue dietary and lifestyle modifications to maintain lipid levels         Dispo: 1 yr  Signed, Oneil Parchment, MD

## 2024-05-08 DIAGNOSIS — I2510 Atherosclerotic heart disease of native coronary artery without angina pectoris: Secondary | ICD-10-CM | POA: Diagnosis not present

## 2024-05-08 DIAGNOSIS — I10 Essential (primary) hypertension: Secondary | ICD-10-CM | POA: Diagnosis not present

## 2024-05-15 DIAGNOSIS — I2510 Atherosclerotic heart disease of native coronary artery without angina pectoris: Secondary | ICD-10-CM | POA: Diagnosis not present

## 2024-05-15 DIAGNOSIS — I10 Essential (primary) hypertension: Secondary | ICD-10-CM | POA: Diagnosis not present

## 2024-05-15 DIAGNOSIS — E7800 Pure hypercholesterolemia, unspecified: Secondary | ICD-10-CM | POA: Diagnosis not present

## 2024-05-15 DIAGNOSIS — M159 Polyosteoarthritis, unspecified: Secondary | ICD-10-CM | POA: Diagnosis not present

## 2024-05-17 ENCOUNTER — Other Ambulatory Visit: Payer: Self-pay | Admitting: Nurse Practitioner

## 2024-06-07 DIAGNOSIS — I2510 Atherosclerotic heart disease of native coronary artery without angina pectoris: Secondary | ICD-10-CM | POA: Diagnosis not present

## 2024-06-07 DIAGNOSIS — I10 Essential (primary) hypertension: Secondary | ICD-10-CM | POA: Diagnosis not present

## 2024-06-10 ENCOUNTER — Other Ambulatory Visit: Payer: Self-pay | Admitting: Nurse Practitioner

## 2024-06-13 DIAGNOSIS — H401122 Primary open-angle glaucoma, left eye, moderate stage: Secondary | ICD-10-CM | POA: Diagnosis not present

## 2024-06-13 DIAGNOSIS — Z961 Presence of intraocular lens: Secondary | ICD-10-CM | POA: Diagnosis not present

## 2024-06-13 DIAGNOSIS — H26492 Other secondary cataract, left eye: Secondary | ICD-10-CM | POA: Diagnosis not present

## 2024-06-13 DIAGNOSIS — H43813 Vitreous degeneration, bilateral: Secondary | ICD-10-CM | POA: Diagnosis not present

## 2024-06-15 DIAGNOSIS — M159 Polyosteoarthritis, unspecified: Secondary | ICD-10-CM | POA: Diagnosis not present

## 2024-06-15 DIAGNOSIS — I10 Essential (primary) hypertension: Secondary | ICD-10-CM | POA: Diagnosis not present

## 2024-06-15 DIAGNOSIS — I2510 Atherosclerotic heart disease of native coronary artery without angina pectoris: Secondary | ICD-10-CM | POA: Diagnosis not present

## 2024-06-15 DIAGNOSIS — E7800 Pure hypercholesterolemia, unspecified: Secondary | ICD-10-CM | POA: Diagnosis not present

## 2024-06-21 NOTE — Progress Notes (Signed)
 Office Visit Note  Patient: Damon Sanders             Date of Birth: [birth year]           MRN: [id number]             PCP: Charlott Dorn LABOR, MD Referring: Charlott Dorn LABOR, * Visit Date: 07/05/2024 Occupation: Data Unavailable  Subjective:  Joint stiffness  History of Present Illness: Damon Sanders is a 78 y.o. male with osteoarthritis.  He returns today after his last visit in May 2025.  At that visit he had right shoulder joint injection.  He states he had good response to the right shoulder joint injection.  He has off-and-on discomfort in his shoulders and his hands.  He states the discomfort in his joints is manageable currently.  He has had no flares of carpal tunnel syndrome.  He has been very busy doing woodwork and yard work.   Activities of Daily Living:  Patient reports morning stiffness for 0 minutes.   Patient Denies nocturnal pain.  Difficulty dressing/grooming: Denies Difficulty climbing stairs: Denies Difficulty getting out of chair: Denies Difficulty using hands for taps, buttons, cutlery, and/or writing: Denies  Review of Systems  Constitutional:  Negative for fatigue.  HENT:  Negative for mouth sores and mouth dryness.   Eyes:  Negative for dryness.  Respiratory:  Negative for shortness of breath.   Cardiovascular:  Negative for chest pain and palpitations.  Gastrointestinal:  Negative for blood in stool, constipation and diarrhea.  Endocrine: Negative for increased urination.  Genitourinary:  Negative for involuntary urination.  Musculoskeletal:  Positive for joint pain and joint pain. Negative for gait problem, joint swelling, myalgias, muscle weakness, morning stiffness, muscle tenderness and myalgias.  Skin:  Negative for color change, rash, hair loss and sensitivity to sunlight.  Allergic/Immunologic: Negative for susceptible to infections.  Neurological:  Negative for dizziness and headaches.  Hematological:  Negative for swollen  glands.  Psychiatric/Behavioral:  Negative for depressed mood and sleep disturbance. The patient is not nervous/anxious.     PMFS History:  Patient Active Problem List   Diagnosis Date Noted   Primary osteoarthritis of both hands 01/05/2024   Pes cavus of both feet 01/05/2024   Coronary artery disease 01/05/2024   Benign essential HTN 10/14/2022   Atrial flutter (HCC) 07/16/2022    Past Medical History:  Diagnosis Date   Hyperlipidemia    Hypertension     Family History  Problem Relation Age of Onset   Colon cancer Father    Esophageal cancer Neg Hx    Rectal cancer Neg Hx    Stomach cancer Neg Hx    Past Surgical History:  Procedure Laterality Date   A-FLUTTER ABLATION N/A 09/15/2022   Procedure: A-FLUTTER ABLATION;  Surgeon: Waddell Danelle ORN, MD;  Location: [facility];  Service: Cardiovascular;  Laterality: N/A;   LEFT HEART CATH  01/11/2019   THYROIDECTOMY, PARTIAL  08/16/1997   Social History   Tobacco Use   Smoking status: Former    Types: Cigarettes    Passive exposure: Never   Smokeless tobacco: Never  Vaping Use   Vaping status: Never Used  Substance Use Topics   Alcohol use: Yes    Comment: occ   Drug use: No   Social History   Social History Narrative   Not on file     Immunization History  Administered Date(s) Administered   PFIZER(Purple Top)SARS-COV-2 Vaccination 09/30/2019, 10/23/2019, 07/19/2020  Objective: Vital Signs: BP (!) 143/73   Pulse 60   Temp 98 F (36.7 C)   Resp 15   Ht 5' 7 (1.702 m)   Wt 168 lb (76.2 kg)   BMI 26.31 kg/m    Physical Exam Vitals and nursing note reviewed.  Constitutional:      Appearance: He is well-developed.  HENT:     Head: Normocephalic and atraumatic.  Eyes:     Conjunctiva/sclera: Conjunctivae normal.     Pupils: Pupils are equal, round, and reactive to light.  Cardiovascular:     Rate and Rhythm: Normal rate and regular rhythm.     Heart sounds: Normal heart sounds.   Pulmonary:     Effort: Pulmonary effort is normal.     Breath sounds: Normal breath sounds.  Abdominal:     General: Bowel sounds are normal.     Palpations: Abdomen is soft.  Musculoskeletal:     Cervical back: Normal range of motion and neck supple.  Skin:    General: Skin is warm and dry.     Capillary Refill: Capillary refill takes less than 2 seconds.  Neurological:     Mental Status: He is alert and oriented to person, place, and time.  Psychiatric:        Behavior: Behavior normal.      Musculoskeletal Exam: Cervical, thoracic or lumbar spine Juengel range of motion.  Shoulders, elbows, wrist joints, MCPs were in good range of motion.  He had bilateral PIP and DIP thickening with no synovitis.  PIP joints and knee joints in good range of motion.  There was no tenderness over ankles or MTPs.  No synovitis was noted on the examination.  CDAI Exam: CDAI Score: -- Patient Global: --; Provider Global: -- Swollen: --; Tender: -- Joint Exam 07/05/2024   No joint exam has been documented for this visit   There is currently no information documented on the homunculus. Go to the Rheumatology activity and complete the homunculus joint exam.  Investigation: No additional findings.  Imaging: No results found.  Recent Labs: Lab Results  Component Value Date   WBC 8.4 10/11/2022   HGB 11.8 (L) 10/11/2022   PLT 206 10/11/2022   NA 142 08/23/2022   K 3.6 08/23/2022   CL 106 08/23/2022   CO2 25 08/23/2022   GLUCOSE 120 (H) 08/23/2022   BUN 13 08/23/2022   CREATININE 0.98 08/23/2022   CALCIUM  9.3 08/23/2022   GFRAA  03/15/2007    >60        The eGFR has been calculated using the MDRD equation. This calculation has not been validated in all clinical    Speciality Comments: No specialty comments available.  Procedures:  No procedures performed Allergies: Caine-1 [lidocaine ], Penicillins, Procaine, and Lisinopril   Assessment / Plan:     Visit Diagnoses: Chronic  pain of both shoulders -he is currently not having much discomfort in his shoulders.  He states the discomfort is off-and-on related to the activities.  He had right shoulder glenohumeral joint injection on Jan 05, 2024 and had a good response.  Status post left shoulder joint injection March 29, 2023.X-rays of bilateral shoulder joints obtained on March 29, 2023 were reviewed which were unremarkable.  Primary osteoarthritis of both hands -he continues to have stiffness in his hands.  Bilateral PIP and DIP thickening was noted.  No synovitis was noted.  March 29 2023 ESR 2, uric acid 4.8, RF negative, anti-CCP negative.  The initial x-rays were  suggestive of osteoarthritis.  Bilateral carpal tunnel syndrome -asymptomatic.  He had bilateral carpal tunnel syndrome injections by Dr. Sissy in the past.  Pes cavus of both feet-proper fitting shoes with arch support were discussed.  Hammertoes of both feet  Other medical problems are listed as follows:  Coronary artery disease involving native coronary artery of native heart with other form of angina pectoris  Typical atrial flutter (HCC)  Benign essential HTN-blood pressure was elevated at 143/78.  Repeat blood pressure was 143/73.  Patient states he monitors blood pressure closely and follows with his PCP.  He states his blood pressure at home remains within normal limits.  Dyslipidemia  History of glaucoma  Orders: No orders of the defined types were placed in this encounter.  No orders of the defined types were placed in this encounter.    Follow-Up Instructions: Return in about 6 months (around 01/02/2025) for Osteoarthritis.   Maya Nash, MD  Note - This record has been created using Animal nutritionist.  Chart creation errors have been sought, but may not always  have been located. Such creation errors do not reflect on  the standard of medical care.

## 2024-07-05 ENCOUNTER — Encounter: Payer: Self-pay | Admitting: Rheumatology

## 2024-07-05 ENCOUNTER — Ambulatory Visit: Attending: Rheumatology | Admitting: Rheumatology

## 2024-07-05 VITALS — BP 143/73 | HR 60 | Temp 98.0°F | Resp 15 | Ht 67.0 in | Wt 168.0 lb

## 2024-07-05 DIAGNOSIS — M2042 Other hammer toe(s) (acquired), left foot: Secondary | ICD-10-CM

## 2024-07-05 DIAGNOSIS — M25511 Pain in right shoulder: Secondary | ICD-10-CM

## 2024-07-05 DIAGNOSIS — I10 Essential (primary) hypertension: Secondary | ICD-10-CM | POA: Diagnosis not present

## 2024-07-05 DIAGNOSIS — M19041 Primary osteoarthritis, right hand: Secondary | ICD-10-CM

## 2024-07-05 DIAGNOSIS — M25512 Pain in left shoulder: Secondary | ICD-10-CM

## 2024-07-05 DIAGNOSIS — G5603 Carpal tunnel syndrome, bilateral upper limbs: Secondary | ICD-10-CM | POA: Diagnosis not present

## 2024-07-05 DIAGNOSIS — Z8669 Personal history of other diseases of the nervous system and sense organs: Secondary | ICD-10-CM

## 2024-07-05 DIAGNOSIS — E785 Hyperlipidemia, unspecified: Secondary | ICD-10-CM

## 2024-07-05 DIAGNOSIS — M2041 Other hammer toe(s) (acquired), right foot: Secondary | ICD-10-CM

## 2024-07-05 DIAGNOSIS — M19042 Primary osteoarthritis, left hand: Secondary | ICD-10-CM

## 2024-07-05 DIAGNOSIS — Q6671 Congenital pes cavus, right foot: Secondary | ICD-10-CM

## 2024-07-05 DIAGNOSIS — I25118 Atherosclerotic heart disease of native coronary artery with other forms of angina pectoris: Secondary | ICD-10-CM

## 2024-07-05 DIAGNOSIS — I483 Typical atrial flutter: Secondary | ICD-10-CM

## 2024-07-05 DIAGNOSIS — G8929 Other chronic pain: Secondary | ICD-10-CM

## 2024-07-05 DIAGNOSIS — Q6672 Congenital pes cavus, left foot: Secondary | ICD-10-CM

## 2024-07-07 DIAGNOSIS — I2510 Atherosclerotic heart disease of native coronary artery without angina pectoris: Secondary | ICD-10-CM | POA: Diagnosis not present

## 2024-07-07 DIAGNOSIS — I10 Essential (primary) hypertension: Secondary | ICD-10-CM | POA: Diagnosis not present

## 2024-07-09 DIAGNOSIS — L03119 Cellulitis of unspecified part of limb: Secondary | ICD-10-CM | POA: Diagnosis not present

## 2024-07-09 DIAGNOSIS — S80811A Abrasion, right lower leg, initial encounter: Secondary | ICD-10-CM | POA: Diagnosis not present

## 2024-07-09 DIAGNOSIS — M79604 Pain in right leg: Secondary | ICD-10-CM | POA: Diagnosis not present

## 2024-07-29 ENCOUNTER — Other Ambulatory Visit: Payer: Self-pay | Admitting: Nurse Practitioner

## 2025-01-04 ENCOUNTER — Ambulatory Visit: Admitting: Rheumatology
